# Patient Record
Sex: Male | Born: 1965 | Race: Black or African American | Hispanic: No | Marital: Married | Smoking: Never smoker
Health system: Southern US, Community
[De-identification: ages and names within clinical notes are randomized; demographics above are authoritative.]

## PROBLEM LIST (undated history)

## (undated) DIAGNOSIS — N433 Hydrocele, unspecified: Secondary | ICD-10-CM

## (undated) DIAGNOSIS — I10 Essential (primary) hypertension: Secondary | ICD-10-CM

## (undated) DIAGNOSIS — R109 Unspecified abdominal pain: Secondary | ICD-10-CM

## (undated) DIAGNOSIS — E7800 Pure hypercholesterolemia, unspecified: Secondary | ICD-10-CM

## (undated) DIAGNOSIS — E785 Hyperlipidemia, unspecified: Secondary | ICD-10-CM

## (undated) DIAGNOSIS — K625 Hemorrhage of anus and rectum: Secondary | ICD-10-CM

## (undated) DIAGNOSIS — M2550 Pain in unspecified joint: Secondary | ICD-10-CM

## (undated) DIAGNOSIS — I861 Scrotal varices: Secondary | ICD-10-CM

## (undated) DIAGNOSIS — J309 Allergic rhinitis, unspecified: Secondary | ICD-10-CM

## (undated) DIAGNOSIS — M109 Gout, unspecified: Secondary | ICD-10-CM

## (undated) DIAGNOSIS — M1990 Unspecified osteoarthritis, unspecified site: Secondary | ICD-10-CM

## (undated) DIAGNOSIS — R079 Chest pain, unspecified: Secondary | ICD-10-CM

## (undated) HISTORY — DX: Pain in unspecified joint: M25.50

## (undated) HISTORY — DX: Chest pain, unspecified: R07.9

## (undated) HISTORY — DX: Unspecified abdominal pain: R10.9

## (undated) HISTORY — DX: Allergic rhinitis, unspecified: J30.9

## (undated) HISTORY — DX: Essential (primary) hypertension: I10

## (undated) HISTORY — DX: Hemorrhage of anus and rectum: K62.5

## (undated) HISTORY — DX: Hydrocele, unspecified: N43.3

## (undated) HISTORY — DX: Pure hypercholesterolemia, unspecified: E78.00

## (undated) HISTORY — DX: Scrotal varices: I86.1

## (undated) HISTORY — PX: NOSE SURGERY: SHX723

---

## 1998-03-28 ENCOUNTER — Emergency Department (HOSPITAL_COMMUNITY): Admission: EM | Admit: 1998-03-28 | Discharge: 1998-03-28 | Payer: Self-pay | Admitting: Emergency Medicine

## 2000-02-04 ENCOUNTER — Encounter: Payer: Self-pay | Admitting: Family Medicine

## 2000-02-04 ENCOUNTER — Ambulatory Visit (HOSPITAL_COMMUNITY): Admission: RE | Admit: 2000-02-04 | Discharge: 2000-02-04 | Payer: Self-pay | Admitting: Family Medicine

## 2003-09-30 ENCOUNTER — Ambulatory Visit (HOSPITAL_COMMUNITY): Admission: RE | Admit: 2003-09-30 | Discharge: 2003-09-30 | Payer: Self-pay | Admitting: Family Medicine

## 2003-10-17 ENCOUNTER — Ambulatory Visit (HOSPITAL_COMMUNITY): Admission: RE | Admit: 2003-10-17 | Discharge: 2003-10-17 | Payer: Self-pay | Admitting: Family Medicine

## 2005-06-11 ENCOUNTER — Emergency Department (HOSPITAL_COMMUNITY): Admission: EM | Admit: 2005-06-11 | Discharge: 2005-06-11 | Payer: Self-pay | Admitting: Emergency Medicine

## 2008-01-13 ENCOUNTER — Ambulatory Visit (HOSPITAL_COMMUNITY): Admission: RE | Admit: 2008-01-13 | Discharge: 2008-01-13 | Payer: Self-pay | Admitting: Family Medicine

## 2009-02-21 ENCOUNTER — Emergency Department (HOSPITAL_COMMUNITY): Admission: EM | Admit: 2009-02-21 | Discharge: 2009-02-21 | Payer: Self-pay | Admitting: Emergency Medicine

## 2010-10-05 ENCOUNTER — Encounter: Payer: Self-pay | Admitting: Emergency Medicine

## 2010-12-22 LAB — DIFFERENTIAL
Basophils Absolute: 0 10*3/uL (ref 0.0–0.1)
Basophils Relative: 0 % (ref 0–1)
Eosinophils Absolute: 0.8 10*3/uL — ABNORMAL HIGH (ref 0.0–0.7)
Eosinophils Relative: 5 % (ref 0–5)
Lymphocytes Relative: 6 % — ABNORMAL LOW (ref 12–46)
Lymphs Abs: 0.9 10*3/uL (ref 0.7–4.0)
Monocytes Absolute: 1.6 10*3/uL — ABNORMAL HIGH (ref 0.1–1.0)
Monocytes Relative: 11 % (ref 3–12)
Neutro Abs: 11.6 10*3/uL — ABNORMAL HIGH (ref 1.7–7.7)
Neutrophils Relative %: 78 % — ABNORMAL HIGH (ref 43–77)

## 2010-12-22 LAB — URINALYSIS, ROUTINE W REFLEX MICROSCOPIC
Glucose, UA: NEGATIVE mg/dL
Hgb urine dipstick: NEGATIVE
Specific Gravity, Urine: 1.022 (ref 1.005–1.030)

## 2010-12-22 LAB — CBC
HCT: 45.7 % (ref 39.0–52.0)
Platelets: 317 10*3/uL (ref 150–400)
RDW: 13.2 % (ref 11.5–15.5)

## 2010-12-22 LAB — COMPREHENSIVE METABOLIC PANEL
AST: 27 U/L (ref 0–37)
Albumin: 4.1 g/dL (ref 3.5–5.2)
Alkaline Phosphatase: 110 U/L (ref 39–117)
BUN: 8 mg/dL (ref 6–23)
GFR calc Af Amer: >60 mL/min (ref >60)
Potassium: 3.3 mEq/L — ABNORMAL LOW (ref 3.5–5.1)
Total Protein: 7.1 g/dL (ref 6.0–8.3)

## 2010-12-22 LAB — LIPASE, BLOOD: Lipase: 79 U/L — ABNORMAL HIGH (ref 11–59)

## 2013-05-03 ENCOUNTER — Emergency Department (HOSPITAL_BASED_OUTPATIENT_CLINIC_OR_DEPARTMENT_OTHER): Payer: 59

## 2013-05-03 ENCOUNTER — Encounter (HOSPITAL_BASED_OUTPATIENT_CLINIC_OR_DEPARTMENT_OTHER): Payer: Self-pay | Admitting: *Deleted

## 2013-05-03 ENCOUNTER — Emergency Department (HOSPITAL_BASED_OUTPATIENT_CLINIC_OR_DEPARTMENT_OTHER)
Admission: EM | Admit: 2013-05-03 | Discharge: 2013-05-03 | Disposition: A | Discharge status: Home / Self Care | Payer: 59 | Attending: Emergency Medicine | Admitting: Emergency Medicine

## 2013-05-03 DIAGNOSIS — Z8639 Personal history of other endocrine, nutritional and metabolic disease: Secondary | ICD-10-CM | POA: Insufficient documentation

## 2013-05-03 DIAGNOSIS — E785 Hyperlipidemia, unspecified: Secondary | ICD-10-CM | POA: Insufficient documentation

## 2013-05-03 DIAGNOSIS — Z862 Personal history of diseases of the blood and blood-forming organs and certain disorders involving the immune mechanism: Secondary | ICD-10-CM | POA: Insufficient documentation

## 2013-05-03 DIAGNOSIS — Z79899 Other long term (current) drug therapy: Secondary | ICD-10-CM | POA: Insufficient documentation

## 2013-05-03 DIAGNOSIS — R079 Chest pain, unspecified: Secondary | ICD-10-CM

## 2013-05-03 DIAGNOSIS — R072 Precordial pain: Secondary | ICD-10-CM | POA: Insufficient documentation

## 2013-05-03 HISTORY — DX: Gout, unspecified: M10.9

## 2013-05-03 HISTORY — DX: Hyperlipidemia, unspecified: E78.5

## 2013-05-03 LAB — CBC
HCT: 42.2 % (ref 39.0–52.0)
Hemoglobin: 15 g/dL (ref 13.0–17.0)
MCV: 80.8 fL (ref 78.0–100.0)
RBC: 5.22 MIL/uL (ref 4.22–5.81)
WBC: 11.3 10*3/uL — ABNORMAL HIGH (ref 4.0–10.5)

## 2013-05-03 LAB — TROPONIN I: Troponin I: <0.3 ng/mL (ref <0.30)

## 2013-05-03 LAB — BASIC METABOLIC PANEL
BUN: 11 mg/dL (ref 6–23)
CO2: 27 mEq/L (ref 19–32)
Chloride: 100 mEq/L (ref 96–112)
Creatinine, Ser: 1 mg/dL (ref 0.50–1.35)

## 2013-05-03 MED ORDER — ASPIRIN 81 MG PO CHEW
324.0000 mg | CHEWABLE_TABLET | Freq: Once | ORAL | Status: AC
  Administered 2013-05-03: 324 mg via ORAL
  Filled 2013-05-03: qty 4

## 2013-05-03 NOTE — ED Provider Notes (Addendum)
CSN: [id number]     Arrival date & time 05/03/13  1057 History     First MD Initiated Contact with Patient 05/03/13 1115     Chief Complaint  Patient presents with  . Chest Pain   (Consider location/radiation/quality/duration/timing/severity/associated sxs/prior Treatment) Patient is a 47 y.o. male presenting with chest pain. The history is provided by the patient.  Chest Pain Pain location:  Substernal area and L chest Pain quality comment:  Heaviness Pain radiates to:  Does not radiate Pain radiates to the back: no   Pain severity:  No pain Onset quality:  Sudden Duration:  24 hours Timing:  Intermittent Progression:  Waxing and waning Chronicity:  New Relieved by:  Nothing Associated symptoms: no abdominal pain, no back pain, no cough, no fever, no nausea, no shortness of breath and not vomiting     Past Medical History  Diagnosis Date  . Gout   . Hyperlipidemia    History reviewed. No pertinent past surgical history. History reviewed. No pertinent family history. History  Substance Use Topics  . Smoking status: Never Smoker   . Smokeless tobacco: Not on file  . Alcohol Use: No    Review of Systems  Constitutional: Negative for fever and chills.  Respiratory: Negative for cough and shortness of breath.   Cardiovascular: Positive for chest pain. Negative for leg swelling.  Gastrointestinal: Negative for nausea, vomiting and abdominal pain.  Musculoskeletal: Negative for back pain.  All other systems reviewed and are negative.    Allergies  Penicillins  Home Medications   Current Outpatient Rx  Name  Route  Sig  Dispense  Refill  . rosuvastatin (CRESTOR) 40 MG tablet   Oral   Take 40 mg by mouth daily.          BP 142/79  Pulse 83  Temp(Src) 98.6 F (37 C) (Oral)  Resp 22  Ht 5\' 5"  (1.651 m)  Wt 183 lb (83.008 kg)  BMI 30.45 kg/m2  SpO2 99% Physical Exam  Nursing note and vitals reviewed. Constitutional: He is oriented to person, place, and  time. He appears well-developed and well-nourished.  HENT:  Head: Normocephalic and atraumatic.  Right Ear: External ear normal.  Left Ear: External ear normal.  Nose: Nose normal.  Eyes: Right eye exhibits no discharge. Left eye exhibits no discharge.  Neck: Neck supple.  Cardiovascular: Normal rate, regular rhythm, normal heart sounds and intact distal pulses.   Pulmonary/Chest: Effort normal and breath sounds normal. He exhibits no tenderness.  Abdominal: Soft. There is no tenderness.  Musculoskeletal: He exhibits no edema and no tenderness.  Neurological: He is alert and oriented to person, place, and time.  Skin: Skin is warm and dry.    ED Course   Procedures (including critical care time)  Labs Reviewed  CBC - Abnormal; Notable for the following:    WBC 11.3 (*)    All other components within normal limits  BASIC METABOLIC PANEL - Abnormal; Notable for the following:    Glucose, Bld 104 (*)    GFR calc non Af Amer 88 (*)    All other components within normal limits  TROPONIN I  TROPONIN I    Date: 05/03/2013  Rate: 79  Rhythm: normal sinus rhythm  QRS Axis: normal  Intervals: normal  ST/T Wave abnormalities: normal  Conduction Disutrbances:none  Narrative Interpretation: NSR w/o ischemia  Old EKG Reviewed: none available   Dg Chest 2 View  05/03/2013   *RADIOLOGY REPORT*  Clinical Data: Chest  pain  CHEST - 2 VIEW  Comparison: None.  Findings:  Lungs clear.  Heart size and pulmonary vascularity are normal.  No adenopathy.  No pneumothorax.  No bone lesions.  IMPRESSION: No abnormality noted.   Original Report Authenticated By: Bretta Bang, M.D.   1. Chest pain     MDM  47 year old male with a history of hyperlipidemia presents with chest pressure that started at work yesterday. The pain is intermittent and at the time of presentation is gone. EKG is benign. Remained pain-free in the ED. Unlikely to be aortic dissection or PE. Patient has no risk factors for  a PE and is PERC negative. Discussed with Dr. Nicholos Johns of [facility], he will help set up a stress test as an outpatient (tomorrow). With 2 negative troponins and patient being pain free and low risk I feel he is stable for outpatient workup.   Audree Camel, MD 05/03/13 1447

## 2013-05-03 NOTE — ED Notes (Signed)
Chest pressure onset 24 hours ago in mid chest then this am more to left side of chest  Denies any radiation of the pain denies increase of pain with deep breath states is somewhat tender when chest wall is palpated. Denies any nausea vomiting diarrhea or diaphoresis

## 2015-01-23 ENCOUNTER — Encounter: Payer: Self-pay | Admitting: *Deleted

## 2015-07-01 ENCOUNTER — Encounter (HOSPITAL_COMMUNITY): Payer: Self-pay | Admitting: Family Medicine

## 2015-07-01 ENCOUNTER — Emergency Department (HOSPITAL_COMMUNITY)
Admission: EM | Admit: 2015-07-01 | Discharge: 2015-07-01 | Disposition: A | Discharge status: Home / Self Care | Payer: Self-pay | Attending: Emergency Medicine | Admitting: Emergency Medicine

## 2015-07-01 ENCOUNTER — Emergency Department (HOSPITAL_COMMUNITY): Payer: Self-pay

## 2015-07-01 DIAGNOSIS — M109 Gout, unspecified: Secondary | ICD-10-CM | POA: Insufficient documentation

## 2015-07-01 DIAGNOSIS — R079 Chest pain, unspecified: Secondary | ICD-10-CM | POA: Insufficient documentation

## 2015-07-01 DIAGNOSIS — Z8639 Personal history of other endocrine, nutritional and metabolic disease: Secondary | ICD-10-CM | POA: Insufficient documentation

## 2015-07-01 DIAGNOSIS — Z880 Allergy status to penicillin: Secondary | ICD-10-CM | POA: Insufficient documentation

## 2015-07-01 DIAGNOSIS — Z7982 Long term (current) use of aspirin: Secondary | ICD-10-CM | POA: Insufficient documentation

## 2015-07-01 LAB — I-STAT TROPONIN, ED
Troponin i, poc: 0 ng/mL (ref 0.00–0.08)
Troponin i, poc: 0.01 ng/mL (ref 0.00–0.08)

## 2015-07-01 LAB — BASIC METABOLIC PANEL
ANION GAP: 8 (ref 5–15)
BUN: 6 mg/dL (ref 6–20)
CHLORIDE: 104 mmol/L (ref 101–111)
CO2: 26 mmol/L (ref 22–32)
Calcium: 9.2 mg/dL (ref 8.9–10.3)
Creatinine, Ser: 1.06 mg/dL (ref 0.61–1.24)
GFR calc non Af Amer: >60 mL/min (ref >60)
GLUCOSE: 147 mg/dL — AB (ref 65–99)
POTASSIUM: 3.5 mmol/L (ref 3.5–5.1)
Sodium: 138 mmol/L (ref 135–145)

## 2015-07-01 LAB — CBC
HEMATOCRIT: 43.1 % (ref 39.0–52.0)
HEMOGLOBIN: 15.2 g/dL (ref 13.0–17.0)
MCH: 28.7 pg (ref 26.0–34.0)
MCHC: 35.3 g/dL (ref 30.0–36.0)
MCV: 81.5 fL (ref 78.0–100.0)
Platelets: 352 10*3/uL (ref 150–400)
RBC: 5.29 MIL/uL (ref 4.22–5.81)
RDW: 12.5 % (ref 11.5–15.5)
WBC: 11.2 10*3/uL — ABNORMAL HIGH (ref 4.0–10.5)

## 2015-07-01 NOTE — ED Provider Notes (Signed)
CSN: [id number]     Arrival date & time 07/01/15  1535 History   First MD Initiated Contact with Patient 07/01/15 1826     Chief Complaint  Patient presents with  . Chest Pain     (Consider location/radiation/quality/duration/timing/severity/associated sxs/prior Treatment) Patient is a 49 y.o. male presenting with chest pain.  Chest Pain Pain location:  Substernal area Pain quality: pressure   Pain radiates to:  Does not radiate Pain radiates to the back: no   Pain severity:  Moderate Onset quality:  Gradual Duration:  2 days Timing:  Constant Progression:  Unchanged Chronicity:  New Context: at rest   Relieved by:  Nothing Worsened by:  Nothing tried Associated symptoms: no abdominal pain, no dizziness, no fever, no nausea, no shortness of breath and not vomiting     Past Medical History  Diagnosis Date  . Gout   . Hyperlipidemia    History reviewed. No pertinent past surgical history. Family History  Problem Relation Age of Onset  . Diabetes Neg Hx   . Hypertension Neg Hx   . Kidney disease Neg Hx   . Heart attack Brother 7753   Social History  Substance Use Topics  . Smoking status: Never Smoker   . Smokeless tobacco: None  . Alcohol Use: No    Review of Systems  Constitutional: Negative for fever.  Respiratory: Negative for shortness of breath.   Cardiovascular: Positive for chest pain.  Gastrointestinal: Negative for nausea, vomiting and abdominal pain.  Neurological: Negative for dizziness.  All other systems reviewed and are negative.     Allergies  Penicillins  Home Medications   Prior to Admission medications   Medication Sig Start Date End Date Taking? Authorizing Provider  aspirin EC 81 MG tablet Take 325 mg by mouth once.   Yes Historical Provider, MD  ibuprofen (ADVIL,MOTRIN) 200 MG tablet Take 400 mg by mouth every 6 (six) hours as needed for headache or mild pain.   Yes Historical Provider, MD   BP 134/79 mmHg  Pulse 67  Temp(Src)  98.4 F (36.9 C) (Oral)  Resp 20  Wt 179 lb 4 oz (81.307 kg)  SpO2 98% Physical Exam  Constitutional: He is oriented to person, place, and time. He appears well-developed and well-nourished.  HENT:  Head: Normocephalic and atraumatic.  Eyes: Conjunctivae and EOM are normal.  Neck: Normal range of motion. Neck supple.  Cardiovascular: Normal rate, regular rhythm and normal heart sounds.   Pulmonary/Chest: Effort normal and breath sounds normal. No respiratory distress.  Abdominal: He exhibits no distension. There is no tenderness. There is no rebound and no guarding.  Musculoskeletal: Normal range of motion.       Cervical back: He exhibits tenderness and bony tenderness.       Thoracic back: Normal.       Lumbar back: Normal.  Neurological: He is alert and oriented to person, place, and time.  No neuro deficits of upper extremities, lower extremities  Skin: Skin is warm and dry.  Vitals reviewed.   ED Course  Procedures (including critical care time) Labs Review Labs Reviewed  BASIC METABOLIC PANEL - Abnormal; Notable for the following:    Glucose, Bld 147 (*)    All other components within normal limits  CBC - Abnormal; Notable for the following:    WBC 11.2 (*)    All other components within normal limits  I-STAT TROPOININ, ED  Rosezena SensorI-STAT TROPOININ, ED    Imaging Review Dg Chest 2 View  07/01/2015  CLINICAL DATA:  Chest pain EXAM: CHEST  2 VIEW COMPARISON:  05/03/2013 chest rate FINDINGS: Stable cardiomediastinal silhouette with normal heart size. No pneumothorax. No pleural effusion. Clear lungs, with no focal lung consolidation and no pulmonary edema. IMPRESSION: No active cardiopulmonary disease. Electronically Signed   By: Delbert Phenix M.D.   On: 07/01/2015 16:16   I have personally reviewed and evaluated these images and lab results as part of my medical decision-making.   EKG Interpretation   Date/Time:  Monday July 01 2015 15:45:24 EDT Ventricular Rate:   65 PR Interval:  158 QRS Duration: 84 QT Interval:  398 QTC Calculation: 413 R Axis:   66 Text Interpretation:  Normal sinus rhythm Normal ECG No significant change  since last tracing Confirmed by Mirian Mo [phone]) on 07/01/2015  7:02:00 PM      MDM   Final diagnoses:  Chest pain, unspecified chest pain type    49 y.o. male with pertinent PMH of HTN, gout, HLD presents with chest pain as above, atypical for ACS.  Has radiation of tingling and pain to R arm from neck.  Physical exam as above with reproduction of symptoms on palpation of neck, no recent trauma.  Otherwise benign.  No pain in chest at time of exam.  Delta trop negative.  Will have pt fu with cardiology.  Standard return precautions given.    I have reviewed all laboratory and imaging studies if ordered as above  1. Chest pain, unspecified chest pain type         Mirian Mo, MD 07/02/15 [phone]

## 2015-07-01 NOTE — ED Notes (Signed)
[facility]. Pt has been having intermittent chest pain for a few days. sts pressure in central chest and some right arm numbness. Pt sts arm numbness has improved.

## 2015-07-01 NOTE — ED Notes (Signed)
MD at bedside. 

## 2015-07-01 NOTE — Discharge Instructions (Signed)
Nonspecific Chest Pain  °Chest pain can be caused by many different conditions. There is always a chance that your pain could be related to something serious, such as a heart attack or a blood clot in your lungs. Chest pain can also be caused by conditions that are not life-threatening. If you have chest pain, it is very important to follow up with your health care provider. °CAUSES  °Chest pain can be caused by: °· Heartburn. °· Pneumonia or bronchitis. °· Anxiety or stress. °· Inflammation around your heart (pericarditis) or lung (pleuritis or pleurisy). °· A blood clot in your lung. °· A collapsed lung (pneumothorax). It can develop suddenly on its own (spontaneous pneumothorax) or from trauma to the chest. °· Shingles infection (varicella-zoster virus). °· Heart attack. °· Damage to the bones, muscles, and cartilage that make up your chest wall. This can include: °¨ Bruised bones due to injury. °¨ Strained muscles or cartilage due to frequent or repeated coughing or overwork. °¨ Fracture to one or more ribs. °¨ Sore cartilage due to inflammation (costochondritis). °RISK FACTORS  °Risk factors for chest pain may include: °· Activities that increase your risk for trauma or injury to your chest. °· Respiratory infections or conditions that cause frequent coughing. °· Medical conditions or overeating that can cause heartburn. °· Heart disease or family history of heart disease. °· Conditions or health behaviors that increase your risk of developing a blood clot. °· Having had chicken pox (varicella zoster). °SIGNS AND SYMPTOMS °Chest pain can feel like: °· Burning or tingling on the surface of your chest or deep in your chest. °· Crushing, pressure, aching, or squeezing pain. °· Dull or sharp pain that is worse when you move, cough, or take a deep breath. °· Pain that is also felt in your back, neck, shoulder, or arm, or pain that spreads to any of these areas. °Your chest pain may come and go, or it may stay  constant. °DIAGNOSIS °Lab tests or other studies may be needed to find the cause of your pain. Your health care provider may have you take a test called an ambulatory ECG (electrocardiogram). An ECG records your heartbeat patterns at the time the test is performed. You may also have other tests, such as: °· Transthoracic echocardiogram (TTE). During echocardiography, sound waves are used to create a picture of all of the heart structures and to look at how blood flows through your heart. °· Transesophageal echocardiogram (TEE). This is a more advanced imaging test that obtains images from inside your body. It allows your health care provider to see your heart in finer detail. °· Cardiac monitoring. This allows your health care provider to monitor your heart rate and rhythm in real time. °· Holter monitor. This is a portable device that records your heartbeat and can help to diagnose abnormal heartbeats. It allows your health care provider to track your heart activity for several days, if needed. °· Stress tests. These can be done through exercise or by taking medicine that makes your heart beat more quickly. °· Blood tests. °· Imaging tests. °TREATMENT  °Your treatment depends on what is causing your chest pain. Treatment may include: °· Medicines. These may include: °¨ Acid blockers for heartburn. °¨ Anti-inflammatory medicine. °¨ Pain medicine for inflammatory conditions. °¨ Antibiotic medicine, if an infection is present. °¨ Medicines to dissolve blood clots. °¨ Medicines to treat coronary artery disease. °· Supportive care for conditions that do not require medicines. This may include: °¨ Resting. °¨ Applying heat   or cold packs to injured areas. °¨ Limiting activities until pain decreases. °HOME CARE INSTRUCTIONS °· If you were prescribed an antibiotic medicine, finish it all even if you start to feel better. °· Avoid any activities that bring on chest pain. °· Do not use any tobacco products, including  cigarettes, chewing tobacco, or electronic cigarettes. If you need help quitting, ask your health care provider. °· Do not drink alcohol. °· Take medicines only as directed by your health care provider. °· Keep all follow-up visits as directed by your health care provider. This is important. This includes any further testing if your chest pain does not go away. °· If heartburn is the cause for your chest pain, you may be told to keep your head raised (elevated) while sleeping. This reduces the chance that acid will go from your stomach into your esophagus. °· Make lifestyle changes as directed by your health care provider. These may include: °¨ Getting regular exercise. Ask your health care provider to suggest some activities that are safe for you. °¨ Eating a heart-healthy diet. A registered dietitian can help you to learn healthy eating options. °¨ Maintaining a healthy weight. °¨ Managing diabetes, if necessary. °¨ Reducing stress. °SEEK MEDICAL CARE IF: °· Your chest pain does not go away after treatment. °· You have a rash with blisters on your chest. °· You have a fever. °SEEK IMMEDIATE MEDICAL CARE IF:  °· Your chest pain is worse. °· You have an increasing cough, or you cough up blood. °· You have severe abdominal pain. °· You have severe weakness. °· You faint. °· You have chills. °· You have sudden, unexplained chest discomfort. °· You have sudden, unexplained discomfort in your arms, back, neck, or jaw. °· You have shortness of breath at any time. °· You suddenly start to sweat, or your skin gets clammy. °· You feel nauseous or you vomit. °· You suddenly feel light-headed or dizzy. °· Your heart begins to beat quickly, or it feels like it is skipping beats. °These symptoms may represent a serious problem that is an emergency. Do not wait to see if the symptoms will go away. Get medical help right away. Call your local emergency services (911 in the [place] Do not drive yourself to the hospital. °  °This  information is not intended to replace advice given to you by your health care provider. Make sure you discuss any questions you have with your health care provider. °  °Document Released: 06/10/2005 Document Revised: 09/21/2014 Document Reviewed: 04/06/2014 °Elsevier Interactive Patient Education ©2016 [employer] ° °

## 2015-07-25 ENCOUNTER — Ambulatory Visit: Payer: Self-pay | Admitting: Cardiovascular Disease

## 2019-07-18 NOTE — Progress Notes (Deleted)
Cardiology Office Note:    Date:  07/18/2019   ID:  DENALI BECVAR, DOB 1966/02/12, MRN [id number]  PCP:  Antony Contras, MD  Cardiologist:  No primary care provider on file.   Referring MD: Scifres, Dorothy, PA-C   No chief complaint on file.   History of Present Illness:    Carl Davis is a 53 y.o. male with a hx of ***  Past Medical History:  Diagnosis Date  . Abdominal pain   . Allergic rhinitis   . Chest pain   . Elevated LDL cholesterol level   . Gout   . HTN (hypertension)   . Hydrocele, bilateral   . Hyperlipidemia   . Joint pain   . Left varicocele   . Rectal bleeding     No past surgical history on file.  Current Medications: No outpatient medications have been marked as taking for the 07/19/19 encounter (Appointment) with Belva Crome, MD.     Allergies:   Penicillins   Social History   Socioeconomic History  . Marital status: Married    Spouse name: Not on file  . Number of children: Not on file  . Years of education: Not on file  . Highest education level: Not on file  Occupational History  . Not on file  Social Needs  . Financial resource strain: Not on file  . Food insecurity    Worry: Not on file    Inability: Not on file  . Transportation needs    Medical: Not on file    Non-medical: Not on file  Tobacco Use  . Smoking status: Never Smoker  . Smokeless tobacco: Never Used  Substance and Sexual Activity  . Alcohol use: No  . Drug use: No  . Sexual activity: Not on file  Lifestyle  . Physical activity    Days per week: Not on file    Minutes per session: Not on file  . Stress: Not on file  Relationships  . Social Herbalist on phone: Not on file    Gets together: Not on file    Attends religious service: Not on file    Active member of club or organization: Not on file    Attends meetings of clubs or organizations: Not on file    Relationship status: Not on file  Other Topics Concern  . Not on file  Social History  Narrative  . Not on file     Family History: The patient's family history includes Heart attack (age of onset: 64) in his brother. There is no history of Diabetes, Hypertension, or Kidney disease.  ROS:   Please see the history of present illness.    *** All other systems reviewed and are negative.  EKGs/Labs/Other Studies Reviewed:    The following studies were reviewed today: ***  EKG:  EKG ***  Recent Labs: No results found for requested labs within last 8760 hours.  Recent Lipid Panel No results found for: CHOL, TRIG, HDL, CHOLHDL, VLDL, LDLCALC, LDLDIRECT  Physical Exam:    VS:  There were no vitals taken for this visit.    Wt Readings from Last 3 Encounters:  07/01/15 179 lb 4 oz (81.3 kg)  05/03/13 183 lb (83 kg)     GEN: ***. No acute distress HEENT: Normal NECK: No JVD. LYMPHATICS: No lymphadenopathy CARDIAC: *** RRR without murmur, gallop, or edema. VASCULAR: *** Normal Pulses. No bruits. RESPIRATORY:  Clear to auscultation without rales, wheezing or rhonchi  ABDOMEN: Soft, non-tender, non-distended, No pulsatile mass, MUSCULOSKELETAL: No deformity  SKIN: Warm and dry NEUROLOGIC:  Alert and oriented x 3 PSYCHIATRIC:  Normal affect   ASSESSMENT:    No diagnosis found. PLAN:    In order of problems listed above:  1. ***   Medication Adjustments/Labs and Tests Ordered: Current medicines are reviewed at length with the patient today.  Concerns regarding medicines are outlined above.  No orders of the defined types were placed in this encounter.  No orders of the defined types were placed in this encounter.   There are no Patient Instructions on file for this visit.   Signed, Lesleigh Noe, MD  07/18/2019 5:37 PM    [facility]

## 2019-07-19 ENCOUNTER — Ambulatory Visit: Payer: Self-pay | Admitting: Interventional Cardiology

## 2019-07-26 ENCOUNTER — Encounter: Payer: Self-pay | Admitting: Family Medicine

## 2019-10-09 ENCOUNTER — Ambulatory Visit
Admission: RE | Admit: 2019-10-09 | Discharge: 2019-10-09 | Disposition: A | Discharge status: Home / Self Care | Payer: Managed Care, Other (non HMO) | Source: Ambulatory Visit | Attending: Family Medicine | Admitting: Family Medicine

## 2019-10-09 ENCOUNTER — Other Ambulatory Visit: Payer: Self-pay | Admitting: Family Medicine

## 2019-10-09 DIAGNOSIS — R1031 Right lower quadrant pain: Secondary | ICD-10-CM

## 2019-12-01 ENCOUNTER — Other Ambulatory Visit: Payer: Self-pay

## 2019-12-01 ENCOUNTER — Encounter: Payer: Self-pay | Admitting: Orthopaedic Surgery

## 2019-12-01 ENCOUNTER — Ambulatory Visit (INDEPENDENT_AMBULATORY_CARE_PROVIDER_SITE_OTHER): Payer: Managed Care, Other (non HMO) | Admitting: Orthopaedic Surgery

## 2019-12-01 ENCOUNTER — Ambulatory Visit: Payer: Self-pay

## 2019-12-01 DIAGNOSIS — M1611 Unilateral primary osteoarthritis, right hip: Secondary | ICD-10-CM

## 2019-12-01 MED ORDER — MELOXICAM 7.5 MG PO TABS: 7.5000 mg | ORAL_TABLET | Freq: Two times a day (BID) | ORAL | 2 refills | Status: DC | PRN

## 2019-12-01 NOTE — Progress Notes (Signed)
Office Visit Note   Patient: Carl Davis           Date of Birth: 1965/12/13           MRN: [id number] Visit Date: 12/01/2019              Requested by: Antony Contras, MD [address] [place],  [place] [address] PCP: Antony Contras, MD   Assessment & Plan: Visit Diagnoses:  1. Primary osteoarthritis of right hip     Plan: Impression is right hip DJD.  We reviewed the x-rays on file which shows moderate hip DJD.  We had a thorough discussion on treatment options and he has elected to start meloxicam as well as a hip injection today.  Patient will follow up as needed.  Questions encouraged and answered.  Follow-Up Instructions: Return if symptoms worsen or fail to improve.   Orders:  Orders Placed This Encounter  Procedures  . US Guided Needle Placement   Meds ordered this encounter  Medications  . meloxicam (MOBIC) 7.5 MG tablet    Sig: Take 1 tablet (7.5 mg total) by mouth 2 (two) times daily as needed for pain.    Dispense:  30 tablet    Refill:  2      Procedures: No procedures performed   Clinical Data: No additional findings.   Subjective: Chief Complaint  Patient presents with  . Right Hip - Pain  . Right Knee - Pain    Carl Davis is a very pleasant 54 year old gentleman comes in for evaluation of right hip and knee pain without known injuries.  He has had this pain for about a year and recently saw his PCP.  They first worked up his back but this turned out to be negative.  He has occasional back pain but no numbness and tingling or radicular symptoms.  He has hip pain that radiates through the thigh down into the knee.  He has trouble putting on his socks he feels stiffness in his hip.  He feels like the leg wants to give out.   Review of Systems  Constitutional: Negative.   All other systems reviewed and are negative.    Objective: Vital Signs: There were no vitals taken for this visit.  Physical Exam Vitals and nursing note  reviewed.  Constitutional:      Appearance: He is well-developed.  HENT:     Head: Normocephalic and atraumatic.  Eyes:     Pupils: Pupils are equal, round, and reactive to light.  Pulmonary:     Effort: Pulmonary effort is normal.  Abdominal:     Palpations: Abdomen is soft.  Musculoskeletal:        General: Normal range of motion.     Cervical back: Neck supple.  Skin:    General: Skin is warm.  Neurological:     Mental Status: He is alert and oriented to person, place, and time.  Psychiatric:        Behavior: Behavior normal.        Thought Content: Thought content normal.        Judgment: Judgment normal.     Ortho Exam Right hip shows moderate stiffness with pain.  Positive Stinchfield sign.  Hip is nontender.  No sciatic tension signs.  Pain with internal and external rotation.  Mildly positive logroll. Knee exam is unremarkable. Specialty Comments:  No specialty comments available.  Imaging: US Guided Needle Placement  Result Date: 12/01/2019 Please see Notes  tab for imaging impression.    PMFS History: Patient Active Problem List   Diagnosis Date Noted  . Primary osteoarthritis of right hip 12/01/2019   Past Medical History:  Diagnosis Date  . Abdominal pain   . Allergic rhinitis   . Chest pain   . Elevated LDL cholesterol level   . Gout   . HTN (hypertension)   . Hydrocele, bilateral   . Hyperlipidemia   . Joint pain   . Left varicocele   . Rectal bleeding     Family History  Problem Relation Age of Onset  . Heart attack Brother 63  . Diabetes Neg Hx   . Hypertension Neg Hx   . Kidney disease Neg Hx     History reviewed. No pertinent surgical history. Social History   Occupational History  . Not on file  Tobacco Use  . Smoking status: Never Smoker  . Smokeless tobacco: Never Used  Substance and Sexual Activity  . Alcohol use: No  . Drug use: No  . Sexual activity: Not on file

## 2019-12-01 NOTE — Progress Notes (Signed)
Subjective: Patient is here for ultrasound-guided intra-articular right hip injection.   Posterior and anterior pain, unable to put on sock.  Objective:  Stiffness and some pain with IR.  Procedure: Ultrasound-guided right hip injection: After sterile prep with Betadine, injected 8 cc 1% lidocaine without epinephrine and 40 mg methylprednisolone using a 22-gauge spinal needle, passing the needle through the iliofemoral ligament into the femoral head/neck junction.  Injectate seen filling joint capsule.  Able to touch right foot afterward.

## 2020-02-02 ENCOUNTER — Telehealth: Payer: Self-pay | Admitting: Orthopaedic Surgery

## 2020-02-02 NOTE — Telephone Encounter (Signed)
Pt called in stating the pain in his hip has returned and he would like to know what are his options in dealing with it?  [phone]

## 2020-02-02 NOTE — Telephone Encounter (Signed)
Appt scheduled with xu for Tuesday.

## 2020-02-02 NOTE — Telephone Encounter (Signed)
It is too soon for another cortisone injection.  We can try a prescription anti-inflammatory.  Would he like to come in for another evaluation.

## 2020-02-02 NOTE — Telephone Encounter (Signed)
Pls advise. Thanks.  

## 2020-02-06 ENCOUNTER — Encounter: Payer: Self-pay | Admitting: Orthopaedic Surgery

## 2020-02-06 ENCOUNTER — Other Ambulatory Visit: Payer: Self-pay

## 2020-02-06 ENCOUNTER — Ambulatory Visit (INDEPENDENT_AMBULATORY_CARE_PROVIDER_SITE_OTHER): Payer: Managed Care, Other (non HMO) | Admitting: Orthopaedic Surgery

## 2020-02-06 DIAGNOSIS — M1611 Unilateral primary osteoarthritis, right hip: Secondary | ICD-10-CM

## 2020-02-06 NOTE — Progress Notes (Signed)
Office Visit Note   Patient: Carl Davis           Date of Birth: 15-Sep-1965           MRN: [id number] Visit Date: 02/06/2020              Requested by: Tally Joe, MD [phone] [address] [place],  [place] [address] PCP: Tally Joe, MD   Assessment & Plan: Visit Diagnoses:  1. Primary osteoarthritis of right hip     Plan: Impression is symptomatic right hip DJD.  He received 1 month of relief from the cortisone injection.  I feel that his DJD is probably more advanced than what the x-rays are demonstrating therefore I would like to get an MRI to fully evaluate the severity of his DJD as well as for a labral tear.  We will see him back after the MRI.  Follow-Up Instructions: Return if symptoms worsen or fail to improve.   Orders:  No orders of the defined types were placed in this encounter.  No orders of the defined types were placed in this encounter.     Procedures: No procedures performed   Clinical Data: No additional findings.   Subjective: Chief Complaint  Patient presents with  . Right Hip - Pain    [name] returns today for follow-up of chronic right hip and groin pain.  Previous cortisone injection gave him 1 month of relief.  During that time he was able to put on socks without pain.  He is now back to the same hip and groin pain.   Review of Systems  Constitutional: Negative.   All other systems reviewed and are negative.    Objective: Vital Signs: There were no vitals taken for this visit.  Physical Exam Vitals and nursing note reviewed.  Constitutional:      Appearance: He is well-developed.  Pulmonary:     Effort: Pulmonary effort is normal.  Abdominal:     Palpations: Abdomen is soft.  Skin:    General: Skin is warm.  Neurological:     Mental Status: He is alert and oriented to person, place, and time.  Psychiatric:        Behavior: Behavior normal.        Thought Content: Thought content normal.        Judgment: Judgment  normal.     Ortho Exam Right hip exam is unchanged. Specialty Comments:  No specialty comments available.  Imaging: No results found.   PMFS History: Patient Active Problem List   Diagnosis Date Noted  . Primary osteoarthritis of right hip 12/01/2019   Past Medical History:  Diagnosis Date  . Abdominal pain   . Allergic rhinitis   . Chest pain   . Elevated LDL cholesterol level   . Gout   . HTN (hypertension)   . Hydrocele, bilateral   . Hyperlipidemia   . Joint pain   . Left varicocele   . Rectal bleeding     Family History  Problem Relation Age of Onset  . Heart attack Brother 60  . Diabetes Neg Hx   . Hypertension Neg Hx   . Kidney disease Neg Hx     History reviewed. No pertinent surgical history. Social History   Occupational History  . Not on file  Tobacco Use  . Smoking status: Never Smoker  . Smokeless tobacco: Never Used  Substance and Sexual Activity  . Alcohol use: No  . Drug use: No  .  Sexual activity: Not on file

## 2020-02-08 ENCOUNTER — Other Ambulatory Visit: Payer: Self-pay

## 2020-02-08 DIAGNOSIS — M1611 Unilateral primary osteoarthritis, right hip: Secondary | ICD-10-CM

## 2020-02-14 ENCOUNTER — Telehealth: Payer: Self-pay | Admitting: Orthopaedic Surgery

## 2020-02-14 NOTE — Telephone Encounter (Signed)
faxed

## 2020-02-14 NOTE — Telephone Encounter (Signed)
Jolie from [facility] called.   They need the patient's MRI orders faxed over   Fax number: [phone] Call back: [phone]

## 2020-03-01 ENCOUNTER — Ambulatory Visit: Payer: 59 | Admitting: Orthopaedic Surgery

## 2020-03-06 ENCOUNTER — Other Ambulatory Visit: Payer: 59

## 2020-03-08 ENCOUNTER — Ambulatory Visit: Payer: 59 | Admitting: Orthopaedic Surgery

## 2020-03-08 ENCOUNTER — Other Ambulatory Visit: Payer: Self-pay | Admitting: Orthopaedic Surgery

## 2020-03-08 DIAGNOSIS — M1611 Unilateral primary osteoarthritis, right hip: Secondary | ICD-10-CM

## 2020-03-26 ENCOUNTER — Ambulatory Visit
Admission: RE | Admit: 2020-03-26 | Discharge: 2020-03-26 | Disposition: A | Discharge status: Home / Self Care | Payer: 59 | Source: Ambulatory Visit | Attending: Orthopaedic Surgery | Admitting: Orthopaedic Surgery

## 2020-03-26 ENCOUNTER — Other Ambulatory Visit: Payer: Self-pay

## 2020-03-26 DIAGNOSIS — M1611 Unilateral primary osteoarthritis, right hip: Secondary | ICD-10-CM

## 2020-03-26 MED ORDER — IOPAMIDOL (ISOVUE-M 200) INJECTION 41%
15.0000 mL | Freq: Once | INTRAMUSCULAR | Status: AC
  Administered 2020-03-26: 15 mL via INTRA_ARTICULAR

## 2020-04-05 ENCOUNTER — Encounter: Payer: Self-pay | Admitting: Orthopaedic Surgery

## 2020-04-05 ENCOUNTER — Ambulatory Visit (INDEPENDENT_AMBULATORY_CARE_PROVIDER_SITE_OTHER): Payer: 59 | Admitting: Orthopaedic Surgery

## 2020-04-05 VITALS — Ht 65.0 in | Wt 182.0 lb

## 2020-04-05 DIAGNOSIS — M1611 Unilateral primary osteoarthritis, right hip: Secondary | ICD-10-CM | POA: Diagnosis not present

## 2020-04-05 MED ORDER — DICLOFENAC SODIUM 75 MG PO TBEC: 75.0000 mg | DELAYED_RELEASE_TABLET | Freq: Two times a day (BID) | ORAL | 2 refills | Status: DC

## 2020-04-06 NOTE — Progress Notes (Signed)
Office Visit Note   Patient: Carl Davis           Date of Birth: Jan 18, 1966           MRN: [id number] Visit Date: 04/05/2020              Requested by: Tally Joe, MD [phone] [address] [place],  [place] [address] PCP: Tally Joe, MD   Assessment & Plan: Visit Diagnoses:  1. Primary osteoarthritis of right hip     Plan: MRI is consistent with end-stage DJD.  These findings were reviewed with the patient in detail and based on our discussion of options he would like to move forward with a total hip replacement at this point.  Risk benefits rehab recovery reviewed with the patient in detail.  Questions encouraged and answered.  Prescription for diclofenac.  We look forward to treating him in the near future.  Follow-Up Instructions: Return if symptoms worsen or fail to improve.   Orders:  No orders of the defined types were placed in this encounter.  Meds ordered this encounter  Medications  . diclofenac (VOLTAREN) 75 MG EC tablet    Sig: Take 1 tablet (75 mg total) by mouth 2 (two) times daily.    Dispense:  30 tablet    Refill:  2      Procedures: No procedures performed   Clinical Data: No additional findings.   Subjective: Chief Complaint  Patient presents with  . Right Hip - Pain, Follow-up    MRI Review    Carl Davis returns today for MRI review of the right hip.  He continues to have severe right hip pain.  Denies any changes in medical history.  He has a difficulty sleeping at night due to the right hip pain.   Review of Systems  Constitutional: Negative.   All other systems reviewed and are negative.    Objective: Vital Signs: Ht 5\' 5"  (1.651 m)   Wt 182 lb (82.6 kg)   BMI 30.29 kg/m   Physical Exam Vitals and nursing note reviewed.  Constitutional:      Appearance: He is well-developed.  Pulmonary:     Effort: Pulmonary effort is normal.  Abdominal:     Palpations: Abdomen is soft.  Skin:    General: Skin is warm.    Neurological:     Mental Status: He is alert and oriented to person, place, and time.  Psychiatric:        Behavior: Behavior normal.        Thought Content: Thought content normal.        Judgment: Judgment normal.     Ortho Exam Right hip exam is unchanged. Specialty Comments:  No specialty comments available.  Imaging: No results found.   PMFS History: Patient Active Problem List   Diagnosis Date Noted  . Primary osteoarthritis of right hip 12/01/2019   Past Medical History:  Diagnosis Date  . Abdominal pain   . Allergic rhinitis   . Chest pain   . Elevated LDL cholesterol level   . Gout   . HTN (hypertension)   . Hydrocele, bilateral   . Hyperlipidemia   . Joint pain   . Left varicocele   . Rectal bleeding     Family History  Problem Relation Age of Onset  . Heart attack Brother 36  . Diabetes Neg Hx   . Hypertension Neg Hx   . Kidney disease Neg Hx     No  past surgical history on file. Social History   Occupational History  . Not on file  Tobacco Use  . Smoking status: Never Smoker  . Smokeless tobacco: Never Used  Substance and Sexual Activity  . Alcohol use: No  . Drug use: No  . Sexual activity: Not on file

## 2020-04-22 ENCOUNTER — Other Ambulatory Visit: Payer: Self-pay

## 2020-05-02 NOTE — Progress Notes (Signed)
DUE TO COVID-19 ONLY ONE VISITOR IS ALLOWED TO COME WITH YOU AND STAY IN THE WAITING ROOM ONLY DURING PRE OP AND PROCEDURE DAY OF SURGERY. THE 1 VISITOR  MAY VISIT WITH YOU AFTER SURGERY IN YOUR PRIVATE ROOM DURING VISITING HOURS ONLY!  YOU NEED TO HAVE A COVID 19 TEST ON_______ @_______ , THIS TEST MUST BE DONE BEFORE SURGERY,  COVID TESTING SITE [address] [place] , IT IS ON THE RIGHT GOING OUT [address] APPROXIMATELY  2 MINUTES PAST [employer] ON THE RIGHT. ONCE YOUR COVID TEST IS COMPLETED,  PLEASE BEGIN THE QUARANTINE INSTRUCTIONS AS OUTLINED IN YOUR HANDOUT.                Carl Davis  05/02/2020   Your procedure is scheduled on:  05/06/2020   Report to [facility] Main  Entrance   Report to admitting at    0730 AM     Call this number if you have problems the morning of surgery [phone]    Remember: Do not eat food   :After Midnight. BRUSH YOUR TEETH MORNING OF SURGERY AND RINSE YOUR MOUTH OUT, NO CHEWING GUM CANDY OR MINTS.  NO SOLID FOOD AFTER MIDNIGHT THE NIGHT PRIOR TO SURGERY. NOTHING BY MOUTH EXCEPT CLEAR LIQUIDS UNTIL .0700am PLEASE FINISH ENSURE DRINK PER SURGEON ORDER  WHICH NEEDS TO BE COMPLETED AT .0700am    CLEAR LIQUID DIET   Foods Allowed                                                                    Coffee and tea, regular and decaf                           Plain Jell-O any favor                                      Fruit ices (not with fruit pulp)                                      Iced Popsicles                                     Carbonated beverages, regular and diet                                    Cranberry, grape and apple juices Sports drinks like Gatorade Lightly seasoned clear broth or consume(fat free) Sugar, honey syrup  _____________________________________________________________________   Take these medicines the morning of surgery with A SIP OF WATER:  Allopurinol, Amlodipine, zyrtec                                 You may not have any metal on your body including hair pins and              piercings  Do not wear jewelry, make-up, lotions, powders or perfumes, deodorant             Do not wear nail polish on your fingernails.  Do not shave  48 hours prior to surgery.              Men may shave face and neck.   Do not bring valuables to the hospital. [facility] IS NOT             RESPONSIBLE   FOR VALUABLES.  Contacts, dentures or bridgework may not be worn into surgery.  Leave suitcase in the car. After surgery it may be brought to your room.     Patients discharged the day of surgery will not be allowed to drive home. IF YOU ARE HAVING SURGERY AND GOING HOME THE SAME DAY, YOU MUST HAVE AN ADULT TO DRIVE YOU HOME AND BE WITH YOU FOR 24 HOURS. YOU MAY GO HOME BY TAXI OR UBER OR ORTHERWISE, BUT AN ADULT MUST ACCOMPANY YOU HOME AND STAY WITH YOU FOR 24 HOURS.  Name and phone number of your driver:  Special Instructions: N/A              Please read over the following fact sheets you were given: _____________________________________________________________________   [facility] - Preparing for Surgery Before surgery, you can play an important role.  Because skin is not sterile, your skin needs to be as free of germs as possible.  You can reduce the number of germs on your skin by washing with CHG (chlorahexidine gluconate) soap before surgery.  CHG is an antiseptic cleaner which kills germs and bonds with the skin to continue killing germs even after washing. Please DO NOT use if you have an allergy to CHG or antibacterial soaps.  If your skin becomes reddened/irritated stop using the CHG and inform your nurse when you arrive at Short Stay. Do not shave (including legs and underarms) for at least 48  hours prior to the first CHG shower.  You may shave your face/neck. Please follow these instructions carefully:  1.  Shower with CHG Soap the night before surgery and the  morning of Surgery.  2.  If you choose to wash your hair, wash your hair first as usual with your  normal  shampoo.  3.  After you shampoo, rinse your hair and body thoroughly to remove the  shampoo.                           4.  Use CHG as you would any other liquid soap.  You can apply chg directly  to the skin and wash                       Gently with a scrungie or clean washcloth.  5.  Apply the CHG Soap to your body ONLY FROM THE NECK DOWN.   Do not use on face/ open  Wound or open sores. Avoid contact with eyes, ears mouth and genitals (private parts).                       Wash face,  Genitals (private parts) with your normal soap.             6.  Wash thoroughly, paying special attention to the area where your surgery  will be performed.  7.  Thoroughly rinse your body with warm water from the neck down.  8.  DO NOT shower/wash with your normal soap after using and rinsing off  the CHG Soap.                9.  Pat yourself dry with a clean towel.            10.  Wear clean pajamas.            11.  Place clean sheets on your bed the night of your first shower and do not  sleep with pets. Day of Surgery : Do not apply any lotions/deodorants the morning of surgery.  Please wear clean clothes to the hospital/surgery center.  FAILURE TO FOLLOW THESE INSTRUCTIONS MAY RESULT IN THE CANCELLATION OF YOUR SURGERY PATIENT SIGNATURE_________________________________  NURSE SIGNATURE__________________________________  ________________________________________________________________________

## 2020-05-03 ENCOUNTER — Other Ambulatory Visit: Payer: Self-pay

## 2020-05-03 ENCOUNTER — Other Ambulatory Visit (HOSPITAL_COMMUNITY): Payer: Managed Care, Other (non HMO)

## 2020-05-03 ENCOUNTER — Other Ambulatory Visit (HOSPITAL_COMMUNITY)
Admission: RE | Admit: 2020-05-03 | Discharge: 2020-05-03 | Disposition: A | Discharge status: Home / Self Care | Payer: Managed Care, Other (non HMO) | Source: Ambulatory Visit | Attending: Orthopaedic Surgery | Admitting: Orthopaedic Surgery

## 2020-05-03 ENCOUNTER — Encounter (HOSPITAL_COMMUNITY): Payer: Self-pay

## 2020-05-03 ENCOUNTER — Ambulatory Visit (HOSPITAL_COMMUNITY)
Admission: RE | Admit: 2020-05-03 | Discharge: 2020-05-03 | Disposition: A | Discharge status: Home / Self Care | Payer: Managed Care, Other (non HMO) | Source: Ambulatory Visit | Attending: Physician Assistant | Admitting: Physician Assistant

## 2020-05-03 ENCOUNTER — Encounter (HOSPITAL_COMMUNITY)
Admission: RE | Admit: 2020-05-03 | Discharge: 2020-05-03 | Disposition: A | Discharge status: Home / Self Care | Payer: Managed Care, Other (non HMO) | Source: Ambulatory Visit | Attending: Orthopaedic Surgery | Admitting: Orthopaedic Surgery

## 2020-05-03 ENCOUNTER — Telehealth: Payer: Self-pay | Admitting: Orthopaedic Surgery

## 2020-05-03 DIAGNOSIS — Z20822 Contact with and (suspected) exposure to covid-19: Secondary | ICD-10-CM | POA: Insufficient documentation

## 2020-05-03 DIAGNOSIS — Z01818 Encounter for other preprocedural examination: Secondary | ICD-10-CM | POA: Insufficient documentation

## 2020-05-03 DIAGNOSIS — M1611 Unilateral primary osteoarthritis, right hip: Secondary | ICD-10-CM | POA: Insufficient documentation

## 2020-05-03 HISTORY — DX: Unspecified osteoarthritis, unspecified site: M19.90

## 2020-05-03 LAB — CBC WITH DIFFERENTIAL/PLATELET
Abs Immature Granulocytes: 0.05 10*3/uL (ref 0.00–0.07)
Basophils Absolute: 0 10*3/uL (ref 0.0–0.1)
Basophils Relative: 0 %
Eosinophils Absolute: 0.2 10*3/uL (ref 0.0–0.5)
Eosinophils Relative: 1 %
HCT: 44.6 % (ref 39.0–52.0)
Hemoglobin: 15.6 g/dL (ref 13.0–17.0)
Immature Granulocytes: 0 %
Lymphocytes Relative: 22 %
Lymphs Abs: 2.6 10*3/uL (ref 0.7–4.0)
MCH: 29 pg (ref 26.0–34.0)
MCHC: 35 g/dL (ref 30.0–36.0)
MCV: 82.9 fL (ref 80.0–100.0)
Monocytes Absolute: 1.2 10*3/uL — ABNORMAL HIGH (ref 0.1–1.0)
Monocytes Relative: 10 %
Neutro Abs: 7.9 10*3/uL — ABNORMAL HIGH (ref 1.7–7.7)
Neutrophils Relative %: 67 %
Platelets: 380 10*3/uL (ref 150–400)
RBC: 5.38 MIL/uL (ref 4.22–5.81)
RDW: 12.8 % (ref 11.5–15.5)
WBC: 12 10*3/uL — ABNORMAL HIGH (ref 4.0–10.5)
nRBC: 0 % (ref 0.0–0.2)

## 2020-05-03 LAB — URINALYSIS, ROUTINE W REFLEX MICROSCOPIC
Bilirubin Urine: NEGATIVE
Glucose, UA: NEGATIVE mg/dL
Hgb urine dipstick: NEGATIVE
Ketones, ur: NEGATIVE mg/dL
Leukocytes,Ua: NEGATIVE
Nitrite: NEGATIVE
Protein, ur: NEGATIVE mg/dL
Specific Gravity, Urine: 1.017 (ref 1.005–1.030)
pH: 7 (ref 5.0–8.0)

## 2020-05-03 LAB — SURGICAL PCR SCREEN
MRSA, PCR: NEGATIVE
Staphylococcus aureus: NEGATIVE

## 2020-05-03 LAB — COMPREHENSIVE METABOLIC PANEL
ALT: 50 U/L — ABNORMAL HIGH (ref 0–44)
AST: 38 U/L (ref 15–41)
Albumin: 4.4 g/dL (ref 3.5–5.0)
Alkaline Phosphatase: 154 U/L — ABNORMAL HIGH (ref 38–126)
Anion gap: 10 (ref 5–15)
BUN: 8 mg/dL (ref 6–20)
CO2: 27 mmol/L (ref 22–32)
Calcium: 9.5 mg/dL (ref 8.9–10.3)
Chloride: 103 mmol/L (ref 98–111)
Creatinine, Ser: 0.95 mg/dL (ref 0.61–1.24)
GFR calc Af Amer: >60 mL/min (ref >60)
GFR calc non Af Amer: >60 mL/min (ref >60)
Glucose, Bld: 103 mg/dL — ABNORMAL HIGH (ref 70–99)
Potassium: 3.7 mmol/L (ref 3.5–5.1)
Sodium: 140 mmol/L (ref 135–145)
Total Bilirubin: 0.7 mg/dL (ref 0.3–1.2)
Total Protein: 7.6 g/dL (ref 6.5–8.1)

## 2020-05-03 LAB — APTT: aPTT: 27 seconds (ref 24–36)

## 2020-05-03 LAB — PROTIME-INR
INR: 0.9 (ref 0.8–1.2)
Prothrombin Time: 12.2 seconds (ref 11.4–15.2)

## 2020-05-03 LAB — SARS CORONAVIRUS 2 (TAT 6-24 HRS): SARS Coronavirus 2: NEGATIVE

## 2020-05-03 LAB — PREALBUMIN: Prealbumin: 30.3 mg/dL (ref 18–38)

## 2020-05-03 NOTE — Progress Notes (Signed)
Called patient to inform patient of time change for surgery on 05/06/20. To arrive 0530 for 0730 surgery. Finish ERAS drink by 0430. He verbalizes understanding.

## 2020-05-03 NOTE — Telephone Encounter (Signed)
[place] forms received. Sent to [employer] 

## 2020-05-05 MED ORDER — TRANEXAMIC ACID 1000 MG/10ML IV SOLN
2000.0000 mg | INTRAVENOUS | Status: DC
  Filled 2020-05-05: qty 20

## 2020-05-05 MED ORDER — BUPIVACAINE LIPOSOME 1.3 % IJ SUSP
10.0000 mL | Freq: Once | INTRAMUSCULAR | Status: DC
  Filled 2020-05-05: qty 10

## 2020-05-06 ENCOUNTER — Encounter (HOSPITAL_COMMUNITY)
Admission: RE | Disposition: A | Discharge status: Home / Self Care | Payer: Self-pay | Source: Home / Self Care | Attending: Orthopaedic Surgery

## 2020-05-06 ENCOUNTER — Observation Stay (HOSPITAL_COMMUNITY): Payer: Managed Care, Other (non HMO)

## 2020-05-06 ENCOUNTER — Other Ambulatory Visit: Payer: Self-pay

## 2020-05-06 ENCOUNTER — Ambulatory Visit (HOSPITAL_COMMUNITY): Payer: Managed Care, Other (non HMO) | Admitting: Certified Registered"

## 2020-05-06 ENCOUNTER — Ambulatory Visit (HOSPITAL_COMMUNITY): Payer: Managed Care, Other (non HMO)

## 2020-05-06 ENCOUNTER — Telehealth: Payer: Self-pay | Admitting: Orthopaedic Surgery

## 2020-05-06 ENCOUNTER — Observation Stay (HOSPITAL_COMMUNITY)
Admission: RE | Admit: 2020-05-06 | Discharge: 2020-05-07 | Disposition: A | Discharge status: Home / Self Care | Payer: Managed Care, Other (non HMO) | Attending: Orthopaedic Surgery | Admitting: Orthopaedic Surgery

## 2020-05-06 ENCOUNTER — Telehealth: Payer: Self-pay

## 2020-05-06 ENCOUNTER — Encounter (HOSPITAL_COMMUNITY): Payer: Self-pay | Admitting: Orthopaedic Surgery

## 2020-05-06 DIAGNOSIS — Z419 Encounter for procedure for purposes other than remedying health state, unspecified: Secondary | ICD-10-CM

## 2020-05-06 DIAGNOSIS — M1611 Unilateral primary osteoarthritis, right hip: Principal | ICD-10-CM | POA: Diagnosis present

## 2020-05-06 DIAGNOSIS — Z96641 Presence of right artificial hip joint: Secondary | ICD-10-CM

## 2020-05-06 DIAGNOSIS — Z7982 Long term (current) use of aspirin: Secondary | ICD-10-CM | POA: Diagnosis not present

## 2020-05-06 DIAGNOSIS — I10 Essential (primary) hypertension: Secondary | ICD-10-CM | POA: Insufficient documentation

## 2020-05-06 DIAGNOSIS — Z96649 Presence of unspecified artificial hip joint: Secondary | ICD-10-CM

## 2020-05-06 HISTORY — PX: TOTAL HIP ARTHROPLASTY: SHX124

## 2020-05-06 LAB — TYPE AND SCREEN
ABO/RH(D): B POS
Antibody Screen: NEGATIVE

## 2020-05-06 LAB — ABO/RH: ABO/RH(D): B POS

## 2020-05-06 SURGERY — ARTHROPLASTY, HIP, TOTAL, ANTERIOR APPROACH
Anesthesia: Spinal | Site: Hip | Laterality: Right

## 2020-05-06 MED ORDER — ATORVASTATIN CALCIUM 40 MG PO TABS
80.0000 mg | ORAL_TABLET | Freq: Every day | ORAL | Status: DC
  Administered 2020-05-07: 80 mg via ORAL
  Filled 2020-05-06: qty 2

## 2020-05-06 MED ORDER — AMISULPRIDE (ANTIEMETIC) 5 MG/2ML IV SOLN: 10.0000 mg | Freq: Once | INTRAVENOUS | Status: DC | PRN

## 2020-05-06 MED ORDER — OXYCODONE HCL ER 10 MG PO T12A
10.0000 mg | EXTENDED_RELEASE_TABLET | Freq: Two times a day (BID) | ORAL | 0 refills | Status: AC
Start: 2020-05-06 — End: ?

## 2020-05-06 MED ORDER — ACETAMINOPHEN 500 MG PO TABS
1000.0000 mg | ORAL_TABLET | Freq: Four times a day (QID) | ORAL | Status: AC
  Administered 2020-05-06 – 2020-05-07 (×4): 1000 mg via ORAL
  Filled 2020-05-06 (×4): qty 2

## 2020-05-06 MED ORDER — BUPIVACAINE LIPOSOME 1.3 % IJ SUSP: 10.0000 mL | Freq: Once | INTRAMUSCULAR | Status: DC

## 2020-05-06 MED ORDER — FENTANYL CITRATE (PF) 100 MCG/2ML IJ SOLN
INTRAMUSCULAR | Status: DC | PRN
Start: 2020-05-06 — End: 2020-05-06
  Administered 2020-05-06: 50 ug via INTRAVENOUS
  Administered 2020-05-06 (×2): 25 ug via INTRAVENOUS

## 2020-05-06 MED ORDER — PROPOFOL 1000 MG/100ML IV EMUL
INTRAVENOUS | Status: AC
  Filled 2020-05-06: qty 100

## 2020-05-06 MED ORDER — OXYCODONE HCL 5 MG PO TABS: 5.0000 mg | ORAL_TABLET | Freq: Once | ORAL | Status: DC | PRN

## 2020-05-06 MED ORDER — ACETAMINOPHEN 160 MG/5ML PO SOLN: 325.0000 mg | Freq: Once | ORAL | Status: DC | PRN

## 2020-05-06 MED ORDER — METOCLOPRAMIDE HCL 5 MG PO TABS: 5.0000 mg | ORAL_TABLET | Freq: Three times a day (TID) | ORAL | Status: DC | PRN

## 2020-05-06 MED ORDER — ACETAMINOPHEN 325 MG PO TABS: 325.0000 mg | ORAL_TABLET | Freq: Four times a day (QID) | ORAL | Status: DC | PRN

## 2020-05-06 MED ORDER — MAGNESIUM CITRATE PO SOLN: 1.0000 | Freq: Once | ORAL | Status: DC | PRN

## 2020-05-06 MED ORDER — OXYCODONE HCL 5 MG PO TABS
10.0000 mg | ORAL_TABLET | ORAL | Status: DC | PRN
  Filled 2020-05-06: qty 2

## 2020-05-06 MED ORDER — METHOCARBAMOL 500 MG PO TABS: 500.0000 mg | ORAL_TABLET | Freq: Four times a day (QID) | ORAL | 0 refills | Status: AC

## 2020-05-06 MED ORDER — HYDROMORPHONE HCL 1 MG/ML IJ SOLN: 0.5000 mg | INTRAMUSCULAR | Status: DC | PRN

## 2020-05-06 MED ORDER — DEXAMETHASONE SODIUM PHOSPHATE 10 MG/ML IJ SOLN
INTRAMUSCULAR | Status: AC
  Filled 2020-05-06: qty 1

## 2020-05-06 MED ORDER — ALUM & MAG HYDROXIDE-SIMETH 200-200-20 MG/5ML PO SUSP: 30.0000 mL | ORAL | Status: DC | PRN

## 2020-05-06 MED ORDER — DOCUSATE SODIUM 100 MG PO CAPS: 100.0000 mg | ORAL_CAPSULE | Freq: Every day | ORAL | 2 refills | Status: AC | PRN

## 2020-05-06 MED ORDER — BUPIVACAINE IN DEXTROSE 0.75-8.25 % IT SOLN
INTRATHECAL | Status: DC | PRN
  Administered 2020-05-06: 1.8 mL via INTRATHECAL

## 2020-05-06 MED ORDER — LIDOCAINE 2% (20 MG/ML) 5 ML SYRINGE
INTRAMUSCULAR | Status: AC
  Filled 2020-05-06: qty 5

## 2020-05-06 MED ORDER — LACTATED RINGERS IV SOLN: INTRAVENOUS | Status: DC

## 2020-05-06 MED ORDER — AMLODIPINE BESYLATE 10 MG PO TABS
10.0000 mg | ORAL_TABLET | Freq: Every day | ORAL | Status: DC
  Administered 2020-05-07: 10 mg via ORAL
  Filled 2020-05-06: qty 1

## 2020-05-06 MED ORDER — ISOPROPYL ALCOHOL 70 % SOLN
Status: AC
  Filled 2020-05-06: qty 480

## 2020-05-06 MED ORDER — POLYETHYLENE GLYCOL 3350 17 G PO PACK: 17.0000 g | PACK | Freq: Every day | ORAL | Status: DC | PRN

## 2020-05-06 MED ORDER — DOCUSATE SODIUM 100 MG PO CAPS
100.0000 mg | ORAL_CAPSULE | Freq: Two times a day (BID) | ORAL | Status: DC
  Administered 2020-05-06 – 2020-05-07 (×3): 100 mg via ORAL
  Filled 2020-05-06 (×3): qty 1

## 2020-05-06 MED ORDER — ONDANSETRON HCL 4 MG PO TABS: 4.0000 mg | ORAL_TABLET | Freq: Four times a day (QID) | ORAL | Status: DC | PRN

## 2020-05-06 MED ORDER — OXYCODONE HCL ER 10 MG PO T12A
10.0000 mg | EXTENDED_RELEASE_TABLET | Freq: Two times a day (BID) | ORAL | Status: DC
  Administered 2020-05-06: 10 mg via ORAL
  Filled 2020-05-06 (×3): qty 1

## 2020-05-06 MED ORDER — MIDAZOLAM HCL 2 MG/2ML IJ SOLN
INTRAMUSCULAR | Status: DC | PRN
  Administered 2020-05-06: 2 mg via INTRAVENOUS

## 2020-05-06 MED ORDER — GABAPENTIN 300 MG PO CAPS
300.0000 mg | ORAL_CAPSULE | Freq: Three times a day (TID) | ORAL | Status: DC
  Administered 2020-05-06 – 2020-05-07 (×4): 300 mg via ORAL
  Filled 2020-05-06 (×4): qty 1

## 2020-05-06 MED ORDER — PROPOFOL 10 MG/ML IV BOLUS
INTRAVENOUS | Status: DC | PRN
  Administered 2020-05-06: 20 mg via INTRAVENOUS
  Administered 2020-05-06 (×2): 10 mg via INTRAVENOUS
  Administered 2020-05-06: 20 mg via INTRAVENOUS

## 2020-05-06 MED ORDER — PHENOL 1.4 % MT LIQD: 1.0000 | OROMUCOSAL | Status: DC | PRN

## 2020-05-06 MED ORDER — ASPIRIN EC 81 MG PO TBEC: 81.0000 mg | DELAYED_RELEASE_TABLET | Freq: Two times a day (BID) | ORAL | 0 refills | Status: AC

## 2020-05-06 MED ORDER — BUPIVACAINE HCL 0.25 % IJ SOLN
INTRAMUSCULAR | Status: AC
  Filled 2020-05-06: qty 1

## 2020-05-06 MED ORDER — IRRISEPT - 450ML BOTTLE WITH 0.05% CHG IN STERILE WATER, USP 99.95% OPTIME
TOPICAL | Status: DC | PRN
  Administered 2020-05-06: 450 mL

## 2020-05-06 MED ORDER — TRANEXAMIC ACID-NACL 1000-0.7 MG/100ML-% IV SOLN
1000.0000 mg | INTRAVENOUS | Status: AC
  Administered 2020-05-06: 1000 mg via INTRAVENOUS
  Filled 2020-05-06: qty 100

## 2020-05-06 MED ORDER — HYDROGEN PEROXIDE 3 % EX SOLN
CUTANEOUS | Status: AC
  Filled 2020-05-06: qty 473

## 2020-05-06 MED ORDER — VANCOMYCIN HCL 1 G IV SOLR
INTRAVENOUS | Status: DC | PRN
  Administered 2020-05-06: 1000 mg via TOPICAL

## 2020-05-06 MED ORDER — FENTANYL CITRATE (PF) 100 MCG/2ML IJ SOLN
INTRAMUSCULAR | Status: AC
  Filled 2020-05-06: qty 2

## 2020-05-06 MED ORDER — CEFAZOLIN SODIUM-DEXTROSE 2-4 GM/100ML-% IV SOLN
2.0000 g | INTRAVENOUS | Status: AC
  Administered 2020-05-06: 2 g via INTRAVENOUS
  Filled 2020-05-06: qty 100

## 2020-05-06 MED ORDER — ONDANSETRON HCL 4 MG PO TABS: 4.0000 mg | ORAL_TABLET | Freq: Three times a day (TID) | ORAL | 0 refills | Status: AC | PRN

## 2020-05-06 MED ORDER — HYDROMORPHONE HCL 1 MG/ML IJ SOLN: 0.2500 mg | INTRAMUSCULAR | Status: DC | PRN

## 2020-05-06 MED ORDER — ONDANSETRON HCL 4 MG/2ML IJ SOLN
INTRAMUSCULAR | Status: DC | PRN
  Administered 2020-05-06: 4 mg via INTRAVENOUS

## 2020-05-06 MED ORDER — SODIUM CHLORIDE 0.9 % IR SOLN
Status: DC | PRN
  Administered 2020-05-06: 3000 mL

## 2020-05-06 MED ORDER — MIDAZOLAM HCL 2 MG/2ML IJ SOLN
INTRAMUSCULAR | Status: AC
  Filled 2020-05-06: qty 2

## 2020-05-06 MED ORDER — PROPOFOL 500 MG/50ML IV EMUL
INTRAVENOUS | Status: DC | PRN
  Administered 2020-05-06: 50 ug/kg/min via INTRAVENOUS

## 2020-05-06 MED ORDER — 0.9 % SODIUM CHLORIDE (POUR BTL) OPTIME
TOPICAL | Status: DC | PRN
  Administered 2020-05-06: 1000 mL

## 2020-05-06 MED ORDER — SODIUM CHLORIDE (PF) 0.9 % IJ SOLN
INTRAMUSCULAR | Status: DC | PRN
  Administered 2020-05-06: 20 mL

## 2020-05-06 MED ORDER — SODIUM CHLORIDE 0.9 % IV SOLN: INTRAVENOUS | Status: DC

## 2020-05-06 MED ORDER — PROPOFOL 10 MG/ML IV BOLUS
INTRAVENOUS | Status: AC
  Filled 2020-05-06: qty 20

## 2020-05-06 MED ORDER — CEFAZOLIN SODIUM-DEXTROSE 2-4 GM/100ML-% IV SOLN
2.0000 g | Freq: Four times a day (QID) | INTRAVENOUS | Status: AC
  Administered 2020-05-06 – 2020-05-07 (×3): 2 g via INTRAVENOUS
  Filled 2020-05-06 (×3): qty 100

## 2020-05-06 MED ORDER — OXYCODONE HCL 5 MG/5ML PO SOLN: 5.0000 mg | Freq: Once | ORAL | Status: DC | PRN

## 2020-05-06 MED ORDER — DIPHENHYDRAMINE HCL 12.5 MG/5ML PO ELIX: 25.0000 mg | ORAL_SOLUTION | ORAL | Status: DC | PRN

## 2020-05-06 MED ORDER — SODIUM CHLORIDE (PF) 0.9 % IJ SOLN
INTRAMUSCULAR | Status: AC
  Filled 2020-05-06: qty 20

## 2020-05-06 MED ORDER — DEXAMETHASONE SODIUM PHOSPHATE 10 MG/ML IJ SOLN
10.0000 mg | Freq: Once | INTRAMUSCULAR | Status: AC
  Administered 2020-05-07: 10 mg via INTRAVENOUS
  Filled 2020-05-06: qty 1

## 2020-05-06 MED ORDER — ACETAMINOPHEN 10 MG/ML IV SOLN: 1000.0000 mg | Freq: Once | INTRAVENOUS | Status: DC | PRN

## 2020-05-06 MED ORDER — METOCLOPRAMIDE HCL 5 MG/ML IJ SOLN: 5.0000 mg | Freq: Three times a day (TID) | INTRAMUSCULAR | Status: DC | PRN

## 2020-05-06 MED ORDER — TRANEXAMIC ACID 1000 MG/10ML IV SOLN
INTRAVENOUS | Status: DC | PRN
  Administered 2020-05-06: 2000 mg via TOPICAL

## 2020-05-06 MED ORDER — BUPIVACAINE HCL (PF) 0.25 % IJ SOLN
INTRAMUSCULAR | Status: DC | PRN
  Administered 2020-05-06: 20 mL

## 2020-05-06 MED ORDER — OXYCODONE HCL 5 MG PO TABS
5.0000 mg | ORAL_TABLET | ORAL | Status: DC | PRN
  Administered 2020-05-06 (×2): 10 mg via ORAL
  Filled 2020-05-06 (×2): qty 2

## 2020-05-06 MED ORDER — MEPERIDINE HCL 50 MG/ML IJ SOLN: 6.2500 mg | INTRAMUSCULAR | Status: DC | PRN

## 2020-05-06 MED ORDER — OXYCODONE-ACETAMINOPHEN 5-325 MG PO TABS: 1.0000 | ORAL_TABLET | Freq: Four times a day (QID) | ORAL | 0 refills | Status: AC | PRN

## 2020-05-06 MED ORDER — MENTHOL 3 MG MT LOZG: 1.0000 | LOZENGE | OROMUCOSAL | Status: DC | PRN

## 2020-05-06 MED ORDER — ONDANSETRON HCL 4 MG/2ML IJ SOLN: 4.0000 mg | Freq: Four times a day (QID) | INTRAMUSCULAR | Status: DC | PRN

## 2020-05-06 MED ORDER — BUPIVACAINE LIPOSOME 1.3 % IJ SUSP
INTRAMUSCULAR | Status: DC | PRN
  Administered 2020-05-06: 20 mL

## 2020-05-06 MED ORDER — DEXAMETHASONE SODIUM PHOSPHATE 10 MG/ML IJ SOLN
INTRAMUSCULAR | Status: DC | PRN
  Administered 2020-05-06: 8 mg via INTRAVENOUS

## 2020-05-06 MED ORDER — ALLOPURINOL 100 MG PO TABS
100.0000 mg | ORAL_TABLET | Freq: Two times a day (BID) | ORAL | Status: DC
  Administered 2020-05-06 – 2020-05-07 (×2): 100 mg via ORAL
  Filled 2020-05-06 (×2): qty 1

## 2020-05-06 MED ORDER — CHLORHEXIDINE GLUCONATE 0.12 % MT SOLN
15.0000 mL | Freq: Once | OROMUCOSAL | Status: AC
  Administered 2020-05-06: 15 mL via OROMUCOSAL

## 2020-05-06 MED ORDER — KETOROLAC TROMETHAMINE 15 MG/ML IJ SOLN
15.0000 mg | Freq: Four times a day (QID) | INTRAMUSCULAR | Status: AC
  Administered 2020-05-06 – 2020-05-07 (×4): 15 mg via INTRAVENOUS
  Filled 2020-05-06 (×4): qty 1

## 2020-05-06 MED ORDER — ONDANSETRON HCL 4 MG/2ML IJ SOLN
INTRAMUSCULAR | Status: AC
  Filled 2020-05-06: qty 2

## 2020-05-06 MED ORDER — ACETAMINOPHEN 325 MG PO TABS: 325.0000 mg | ORAL_TABLET | Freq: Once | ORAL | Status: DC | PRN

## 2020-05-06 MED ORDER — POVIDONE-IODINE 10 % EX SWAB
2.0000 "application " | Freq: Once | CUTANEOUS | Status: AC
  Administered 2020-05-06: 2 via TOPICAL

## 2020-05-06 MED ORDER — SORBITOL 70 % SOLN
30.0000 mL | Freq: Every day | Status: DC | PRN
  Filled 2020-05-06: qty 30

## 2020-05-06 MED ORDER — ASPIRIN 81 MG PO CHEW
81.0000 mg | CHEWABLE_TABLET | Freq: Two times a day (BID) | ORAL | Status: DC
  Administered 2020-05-06 – 2020-05-07 (×2): 81 mg via ORAL
  Filled 2020-05-06 (×2): qty 1

## 2020-05-06 MED ORDER — ORAL CARE MOUTH RINSE: 15.0000 mL | Freq: Once | OROMUCOSAL | Status: AC

## 2020-05-06 SURGICAL SUPPLY — 61 items
ACETAB CUP W/GRIPTION 54 (Plate) ×2 IMPLANT
BAG SPEC THK2 15X12 ZIP CLS (MISCELLANEOUS) ×1
BAG ZIPLOCK 12X15 (MISCELLANEOUS) ×2 IMPLANT
CELLS DAT CNTRL 66122 CELL SVR (MISCELLANEOUS) ×1 IMPLANT
COVER PERINEAL POST (MISCELLANEOUS) ×2 IMPLANT
COVER SURGICAL LIGHT HANDLE (MISCELLANEOUS) ×2 IMPLANT
CUP ACETAB W/GRIPTION 54 (Plate) IMPLANT
DECANTER SPIKE VIAL GLASS SM (MISCELLANEOUS) ×2 IMPLANT
DRAPE IMP U-DRAPE 54X76 (DRAPES) ×4 IMPLANT
DRAPE POUCH INSTRU U-SHP 10X18 (DRAPES) ×2 IMPLANT
DRAPE STERI IOBAN 125X83 (DRAPES) ×2 IMPLANT
DRAPE U-SHAPE 47X51 STRL (DRAPES) ×4 IMPLANT
DRESSING AQUACEL AG SP 3.5X6 (GAUZE/BANDAGES/DRESSINGS) IMPLANT
DRSG AQUACEL AG SP 3.5X6 (GAUZE/BANDAGES/DRESSINGS) ×2
DRSG MEPILEX BORDER 4X8 (GAUZE/BANDAGES/DRESSINGS) ×2 IMPLANT
DURAPREP 26ML APPLICATOR (WOUND CARE) ×4 IMPLANT
ELECT BLADE TIP CTD 4 INCH (ELECTRODE) ×2 IMPLANT
ELECT REM PT RETURN 15FT ADLT (MISCELLANEOUS) ×2 IMPLANT
FACESHIELD WRAPAROUND (MASK) ×2 IMPLANT
FACESHIELD WRAPAROUND OR TEAM (MASK) IMPLANT
GLOVE BIOGEL PI IND STRL 7.0 (GLOVE) ×1 IMPLANT
GLOVE BIOGEL PI INDICATOR 7.0 (GLOVE) ×1
GLOVE ECLIPSE 7.0 STRL STRAW (GLOVE) ×5 IMPLANT
GLOVE SKINSENSE NS SZ7.5 (GLOVE) ×1
GLOVE SKINSENSE STRL SZ7.5 (GLOVE) ×1 IMPLANT
GLOVE SURG SYN 7.5  E (GLOVE) ×8
GLOVE SURG SYN 7.5 E (GLOVE) ×4 IMPLANT
GLOVE SURG SYN 7.5 PF PI (GLOVE) ×4 IMPLANT
GOWN STRL REIN XL XLG (GOWN DISPOSABLE) ×4 IMPLANT
GOWN STRL REUS W/TWL LRG LVL3 (GOWN DISPOSABLE) ×2 IMPLANT
HANDPIECE INTERPULSE COAX TIP (DISPOSABLE) ×2
HEAD CERAMIC 36 PLUS5 (Hips) ×1 IMPLANT
HOOD PEEL AWAY FLYTE STAYCOOL (MISCELLANEOUS) ×6 IMPLANT
JET LAVAGE IRRISEPT WOUND (IRRIGATION / IRRIGATOR) ×2
KIT BASIN OR (CUSTOM PROCEDURE TRAY) ×2 IMPLANT
KIT TURNOVER KIT A (KITS) ×2 IMPLANT
LAVAGE JET IRRISEPT WOUND (IRRIGATION / IRRIGATOR) ×1 IMPLANT
LINER NEUTRAL 54X36MM PLUS 4 (Hips) ×1 IMPLANT
MARKER SKIN DUAL TIP RULER LAB (MISCELLANEOUS) ×2 IMPLANT
NDL SPNL 18GX3.5 QUINCKE PK (NEEDLE) ×1 IMPLANT
NEEDLE SPNL 18GX3.5 QUINCKE PK (NEEDLE) ×2 IMPLANT
PACK ANTERIOR HIP CUSTOM (KITS) ×2 IMPLANT
PACK UNIVERSAL I (CUSTOM PROCEDURE TRAY) ×2 IMPLANT
PENCIL SMOKE EVACUATOR (MISCELLANEOUS) IMPLANT
RETRACTOR WND ALEXIS 18 MED (MISCELLANEOUS) ×1 IMPLANT
RTRCTR WOUND ALEXIS 18CM MED (MISCELLANEOUS) ×2
SAW OSC TIP CART 19.5X105X1.3 (SAW) ×2 IMPLANT
SCREW 6.5MMX25MM (Screw) ×1 IMPLANT
SET HNDPC FAN SPRY TIP SCT (DISPOSABLE) ×1 IMPLANT
STEM FEM ACTIS HIGH SZ3 (Stem) ×2 IMPLANT
SUT ETHIBOND 2 V 37 (SUTURE) ×2 IMPLANT
SUT MNCRL AB 3-0 PS2 18 (SUTURE) ×1 IMPLANT
SUT VIC AB 0 CT1 36 (SUTURE) ×2 IMPLANT
SUT VIC AB 1 CTX 36 (SUTURE) ×2
SUT VIC AB 1 CTX36XBRD ANBCTR (SUTURE) ×1 IMPLANT
SUT VIC AB 2-0 CT1 27 (SUTURE) ×2
SUT VIC AB 2-0 CT1 TAPERPNT 27 (SUTURE) ×1 IMPLANT
TOWEL OR 17X26 10 PK STRL BLUE (TOWEL DISPOSABLE) ×2 IMPLANT
TOWEL OR NON WOVEN STRL DISP B (DISPOSABLE) ×2 IMPLANT
TRAY FOLEY MTR SLVR 16FR STAT (SET/KITS/TRAYS/PACK) ×1 IMPLANT
YANKAUER SUCT BULB TIP NO VENT (SUCTIONS) ×4 IMPLANT

## 2020-05-06 NOTE — Anesthesia Postprocedure Evaluation (Signed)
Anesthesia Post Note  Patient: Major J Zartman  Procedure(s) Performed: RIGHT TOTAL HIP ARTHROPLASTY ANTERIOR APPROACH (Right Hip)     Patient location during evaluation: PACU Anesthesia Type: Spinal and MAC Level of consciousness: oriented and awake and alert Pain management: pain level controlled Vital Signs Assessment: post-procedure vital signs reviewed and stable Respiratory status: spontaneous breathing and respiratory function stable Cardiovascular status: blood pressure returned to baseline and stable Postop Assessment: no headache, no backache, no apparent nausea or vomiting and spinal receding Anesthetic complications: no   No complications documented.  Last Vitals:  Vitals:   05/06/20 0954 05/06/20 1000  BP: 133/66 135/77  Pulse: 88 79  Resp: 13 16  Temp: 36.6 C   SpO2: 100% 99%    Last Pain:  Vitals:   05/06/20 0954  TempSrc:   PainSc: 0-No pain    LLE Motor Response: Purposeful movement (05/06/20 0954)   RLE Motor Response: Purposeful movement (05/06/20 0954)   L Sensory Level: L3-Anterior knee, lower leg (05/06/20 0954) R Sensory Level: L4-Anterior knee, lower leg (05/06/20 0954)  Pervis Hocking

## 2020-05-06 NOTE — Telephone Encounter (Signed)
Pharmacist Jeanie Cooks from [facility] requesting a call back. Pharmacist has a couple of question about 2 medication refills. Please call Carl Davis at [phone].

## 2020-05-06 NOTE — Op Note (Signed)
RIGHT TOTAL HIP ARTHROPLASTY ANTERIOR APPROACH  Procedure Note Carl Davis   [id number]  Pre-op Diagnosis: right hip degenerative joint disease     Post-op Diagnosis: same   Operative Procedures  1. Total hip replacement; Right hip; uncemented [id number]   Personnel  Surgeon(s): Tarry Kos, MD  Assist: Oneal Grout, PA-C; necessary for the timely completion of procedure and due to complexity of procedure.   Anesthesia: spinal, local  Prosthesis: Depuy Acetabulum: Pinnacle 54 mm Femur: Actis 3 HO Head: 36 mm size: +5 Liner: +4 neutral Bearing Type: ceramic on poly  Total Hip Arthroplasty (Anterior Approach) Op Note:  After informed consent was obtained and the operative extremity marked in the holding area, the patient was brought back to the operating room and placed supine on the HANA table. Next, the operative extremity was prepped and draped in normal sterile fashion. Surgical timeout occurred verifying patient identification, surgical site, surgical procedure and administration of antibiotics.  A modified anterior Smith-Peterson approach to the hip was performed, using the interval between tensor fascia lata and sartorius.  Dissection was carried bluntly down onto the anterior hip capsule. The lateral femoral circumflex vessels were identified and coagulated. A capsulotomy was performed and the capsular flaps tagged for later repair.  Of note, the hip joint was fairly tight.  The neck osteotomy was performed. The femoral head was removed, the acetabular rim was cleared of soft tissue and attention was turned to reaming the acetabulum.  Sequential reaming was performed under fluoroscopic guidance. We reamed to a size 53 mm, and then impacted the acetabular shell. A 25 mm cancellous screw was placed through the shell for added fixation.  The liner was then placed after irrigation and attention turned to the femur.  After placing the femoral hook, the leg was taken to  externally rotated, extended and adducted position taking care to perform soft tissue releases to allow for adequate mobilization of the femur. Soft tissue was cleared from the shoulder of the greater trochanter and the hook elevator used to improve exposure of the proximal femur. Sequential broaching performed up to a size 3. The cancellous bone quality was excellent.  Trial neck and head were placed. The leg was brought back up to neutral and the construct reduced.  Antibiotic irrigation was placed in the surgical wound and kept for at least 1 minute.  The position and sizing of components, offset and leg lengths were checked using fluoroscopy. Stability of the construct was checked in extension and external rotation without any subluxation or impingement of prosthesis with a +5 head. We dislocated the prosthesis, dropped the leg back into position, removed trial components, and irrigated copiously. The final stem and head was then placed, the leg brought back up, the system reduced and fluoroscopy used to verify positioning.  We irrigated, obtained hemostasis and closed the capsule using #2 ethibond suture.  One gram of vancomycin powder was placed in the surgical bed. A dilute solution of 20 cc of normal saline, 1.3% exparel, 0.25% bupivacaine was injected in the soft tissues.  One gram of topical tranexamic acid was injected into the joint.  The fascia was closed with #1 vicryl plus, the deep fat layer was closed with 0 vicryl, the subcutaneous layers closed with 2.0 Vicryl Plus and the skin closed with 3.0 monocryl and steri strips. A sterile dressing was applied. The patient was awakened in the operating room and taken to recovery in stable condition.  All sponge, needle, and instrument  counts were correct at the end of the case.   Position: supine  Complications: see description of procedure.  Time Out: performed   Drains/Packing: none  Estimated blood loss: see anesthesia record  Returned to  Recovery Room: in good condition.   Antibiotics: yes   Mechanical VTE (DVT) Prophylaxis: sequential compression devices, TED thigh-high  Chemical VTE (DVT) Prophylaxis: aspirin   Fluid Replacement: see anesthesia record  Specimens Removed: 1 to pathology   Sponge and Instrument Count Correct? yes   PACU: portable radiograph - low AP   Plan/RTC: Return in 2 weeks for staple removal. Weight Bearing/Load Lower Extremity: full  Hip precautions: none Suture Removal: 2 weeks   N. Glee Arvin, MD [facility] 9:22 AM   Implant Name Type Inv. Item Serial No. Manufacturer Lot No. LRB No. Used Action  ACETABULAR CUP Cathe Mons - [id number] Plate ACETABULAR CUP W GRIPTION  [employer] [id number] Right 1 Implanted  SCREW 6.5MMX25MM - [id number] Screw SCREW 6.5MMX25MM  [employer] [id number] Right 1 Implanted  LINER NEUTRAL 54X36MM PLUS 4 - [id number] Hips LINER NEUTRAL 54X36MM PLUS 4  [employer] [id number] Right 1 Implanted  STEM FEM ACTIS HIGH SZ3 - [id number] Stem STEM FEM ACTIS HIGH SZ3  [employer] [id number] Right 1 Implanted  HEAD CERAMIC 36 PLUS5 - [id number] Hips HEAD CERAMIC 36 PLUS5  [employer] [id number] Right 1 Implanted

## 2020-05-06 NOTE — Anesthesia Preprocedure Evaluation (Addendum)
Anesthesia Evaluation  Patient identified by MRN, date of birth, ID band Patient awake    Reviewed: Allergy & Precautions, NPO status , Patient's Chart, lab work & pertinent test results  Airway Mallampati: III  TM Distance: >3 FB Neck ROM: Full    Dental  (+) Missing, Dental Advisory Given   Pulmonary neg pulmonary ROS,    breath sounds clear to auscultation       Cardiovascular hypertension,  Rhythm:Regular Rate:Normal     Neuro/Psych negative neurological ROS  negative psych ROS   GI/Hepatic negative GI ROS, Neg liver ROS,   Endo/Other  negative endocrine ROS  Renal/GU negative Renal ROS     Musculoskeletal  (+) Arthritis ,   Abdominal (+) + obese,   Peds  Hematology negative hematology ROS (+)   Anesthesia Other Findings   Reproductive/Obstetrics                            Anesthesia Physical Anesthesia Plan  ASA: II  Anesthesia Plan: Spinal   Post-op Pain Management:    Induction: Intravenous  PONV Risk Score and Plan:   Airway Management Planned: Natural Airway and Simple Face Mask  Additional Equipment: None  Intra-op Plan:   Post-operative Plan:   Informed Consent: I have reviewed the patients History and Physical, chart, labs and discussed the procedure including the risks, benefits and alternatives for the proposed anesthesia with the patient or authorized representative who has indicated his/her understanding and acceptance.       Plan Discussed with: CRNA  Anesthesia Plan Comments: (Lab Results      Component                Value               Date                      WBC                      12.0 (H)            05/03/2020                HGB                      15.6                05/03/2020                HCT                      44.6                05/03/2020                MCV                      82.9                05/03/2020                PLT                       380                 05/03/2020  EKG: normal sinus rhythm. )        Anesthesia Quick Evaluation

## 2020-05-06 NOTE — Anesthesia Procedure Notes (Signed)
Procedure Name: MAC Date/Time: 05/06/2020 7:37 AM Performed by: Eben Burow, CRNA Pre-anesthesia Checklist: Patient identified, Emergency Drugs available, Suction available, Patient being monitored and Timeout performed Oxygen Delivery Method: Simple face mask Placement Confirmation: positive ETCO2

## 2020-05-06 NOTE — Telephone Encounter (Signed)
[facility] called needing diagnosis code for pain medication submitted today. Provided this information to pharmacist.

## 2020-05-06 NOTE — Discharge Instructions (Signed)

## 2020-05-06 NOTE — Evaluation (Signed)
Physical Therapy Evaluation Patient Details Name: Carl Davis MRN: [id number] DOB: 11-Jul-1966 Today's Date: 05/06/2020   History of Present Illness  Patient is a 54 y.o. male s/p Rt THA anterior approach on 05/06/20 with PMH significant for HLD, HTN, Gout, OA.     Clinical Impression  Jeiden GARYSON STELLY is a 54 y.o. male POD 0 s/p Rt THA. Patient reports independence with mobility at baseline. Patient is now limited by functional impairments (see PT problem list below) and requires min assist for transfers and gait with RW. Patient was able to ambulate ~50 feet with RW and min assist. Patient instructed in exercise to facilitate ROM and circulation. Patient will benefit from continued skilled PT interventions to address impairments and progress towards PLOF. Acute PT will follow to progress mobility and stair training in preparation for safe discharge home.     Follow Up Recommendations Follow surgeon's recommendation for DC plan and follow-up therapies;Home health PT    Equipment Recommendations  Rolling walker with 5" wheels;3in1 (PT)    Recommendations for Other Services       Precautions / Restrictions Precautions Precautions: Fall Restrictions Weight Bearing Restrictions: No RLE Weight Bearing: Weight bearing as tolerated      Mobility  Bed Mobility Overal bed mobility: Needs Assistance Bed Mobility: Supine to Sit     Supine to sit: Min assist;HOB elevated     General bed mobility comments: cues to use bed rail, assist required to bring Rt LE off EOB and scoot to EOB.  Transfers Overall transfer level: Needs assistance Equipment used: Rolling walker (2 wheeled) Transfers: Sit to/from Stand Sit to Stand: Min assist         General transfer comment: Cues for technique with RW, min assist for power up and to steady with rise from EOB.   Ambulation/Gait Ambulation/Gait assistance: Min assist Gait Distance (Feet): 50 Feet Assistive device: Rolling walker (2 wheeled) Gait  Pattern/deviations: Step-to pattern;Decreased stride length;Decreased stance time - right;Decreased weight shift to right Gait velocity: decr   General Gait Details: cues for step pattern and safe proximity to RW, no overt LOB throughout, min assist for walker management.  Stairs         Wheelchair Mobility    Modified Rankin (Stroke Patients Only)       Balance Overall balance assessment: Needs assistance Sitting-balance support: Feet supported Sitting balance-Leahy Scale: Good     Standing balance support: During functional activity;Bilateral upper extremity supported Standing balance-Leahy Scale: Fair            Pertinent Vitals/Pain Pain Assessment: 0-10 Pain Score: 2  Pain Location: Rt hip Pain Descriptors / Indicators: Aching;Discomfort Pain Intervention(s): Limited activity within patient's tolerance;Monitored during session;Premedicated before session;Repositioned    Home Living Family/patient expects to be discharged to:: Private residence Living Arrangements: Spouse/significant other;Children Available Help at Discharge: Family Type of Home: House Home Access: Stairs to enter Entrance Stairs-Rails: None Entrance Stairs-Number of Steps: 3 Home Layout: Two level;Able to live on main level with bedroom/bathroom;1/2 bath on main level (sometimes sleeps on couch on main level) Home Equipment: None      Prior Function Level of Independence: Independent         Comments: works full time at [employer]   Dominant Hand: Right    Extremity/Trunk Assessment   Upper Extremity Assessment Upper Extremity Assessment: Overall WFL for tasks assessed    Lower Extremity Assessment Lower Extremity Assessment: RLE deficits/detail RLE Deficits / Details: god  quad activation RLE Sensation: WNL RLE Coordination: WNL    Cervical / Trunk Assessment Cervical / Trunk Assessment: Normal  Communication   Communication: No difficulties  Cognition  Arousal/Alertness: Awake/alert Behavior During Therapy: WFL for tasks assessed/performed Overall Cognitive Status: Within Functional Limits for tasks assessed             General Comments      Exercises Total Joint Exercises Ankle Circles/Pumps: AROM;Both;20 reps;Seated Quad Sets: AROM;Right;5 reps;Seated Heel Slides: AROM;Right;5 reps;Seated   Assessment/Plan    PT Assessment Patient needs continued PT services  PT Problem List Decreased range of motion;Decreased strength;Decreased activity tolerance;Decreased balance;Decreased mobility;Decreased knowledge of use of DME;Decreased safety awareness;Pain       PT Treatment Interventions DME instruction;Gait training;Stair training;Functional mobility training;Therapeutic activities;Therapeutic exercise;Balance training;Patient/family education    PT Goals (Current goals can be found in the Care Plan section)  Acute Rehab PT Goals Patient Stated Goal: get home PT Goal Formulation: With patient Time For Goal Achievement: 05/13/20 Potential to Achieve Goals: Good    Frequency 7X/week   Barriers to discharge           AM-PAC PT "6 Clicks" Mobility  Outcome Measure Help needed turning from your back to your side while in a flat bed without using bedrails?: A Little Help needed moving from lying on your back to sitting on the side of a flat bed without using bedrails?: A Little Help needed moving to and from a bed to a chair (including a wheelchair)?: A Little Help needed standing up from a chair using your arms (e.g., wheelchair or bedside chair)?: A Little Help needed to walk in hospital room?: A Little Help needed climbing 3-5 steps with a railing? : A Little 6 Click Score: 18    End of Session Equipment Utilized During Treatment: Gait belt Activity Tolerance: Patient tolerated treatment well Patient left: in chair;with call bell/phone within reach;with chair alarm set Nurse Communication: Mobility status PT Visit  Diagnosis: Muscle weakness (generalized) (M62.81);Difficulty in walking, not elsewhere classified (R26.2)    Time: 7628-3151 PT Time Calculation (min) (ACUTE ONLY): 23 min   Charges:   PT Evaluation $PT Eval Low Complexity: 1 Low PT Treatments $Therapeutic Exercise: 8-22 mins        Wynn Maudlin, DPT Acute Rehabilitation Services  Office [phone] Pager [phone]  05/06/2020 3:55 PM

## 2020-05-06 NOTE — Transfer of Care (Signed)
Immediate Anesthesia Transfer of Care Note  Patient: Carl Davis  Procedure(s) Performed: RIGHT TOTAL HIP ARTHROPLASTY ANTERIOR APPROACH (Right Hip)  Patient Location: PACU  Anesthesia Type:Spinal  Level of Consciousness: awake and patient cooperative  Airway & Oxygen Therapy: Patient Spontanous Breathing and Patient connected to face mask oxygen  Post-op Assessment: Report given to RN and Post -op Vital signs reviewed and stable  Post vital signs: Reviewed and stable  Last Vitals:  Vitals Value Taken Time  BP 133/66 05/06/20 0953  Temp    Pulse 85 05/06/20 0955  Resp 20 05/06/20 0955  SpO2 100 % 05/06/20 0955  Vitals shown include unvalidated device data.  Last Pain:  Vitals:   05/06/20 0612  TempSrc:   PainSc: 2       Patients Stated Pain Goal: 4 (05/06/20 0612)  Complications: No complications documented.

## 2020-05-06 NOTE — Telephone Encounter (Signed)
I called pharmacy to check this and she states she is concerned of an overdose while taking both Oxycontin and Oxycodone and would not fill them without authorization from [place]  She states she is aware that he had replacement but it was a high MME

## 2020-05-06 NOTE — Telephone Encounter (Signed)
r 

## 2020-05-06 NOTE — H&P (Signed)
PREOPERATIVE H&P  Chief Complaint: right hip degenerative joint disease  HPI: Carl Davis is a 54 y.o. male who presents for surgical treatment of right hip degenerative joint disease.  He denies any changes in medical history.  Past Medical History:  Diagnosis Date  . Abdominal pain   . Allergic rhinitis   . Arthritis   . Chest pain   . Elevated LDL cholesterol level   . Gout   . HTN (hypertension)   . Hydrocele, bilateral   . Hyperlipidemia   . Joint pain   . Left varicocele   . Rectal bleeding    Past Surgical History:  Procedure Laterality Date  . NOSE SURGERY     Social History   Socioeconomic History  . Marital status: Married    Spouse name: Not on file  . Number of children: 3  . Years of education: 71  . Highest education level: Not on file  Occupational History  . Not on file  Tobacco Use  . Smoking status: Never Smoker  . Smokeless tobacco: Never Used  Vaping Use  . Vaping Use: Never used  Substance and Sexual Activity  . Alcohol use: No  . Drug use: No  . Sexual activity: Not on file  Other Topics Concern  . Not on file  Social History Narrative  . Not on file   Social Determinants of Health   Financial Resource Strain:   . Difficulty of Paying Living Expenses: Not on file  Food Insecurity:   . Worried About Programme researcher, broadcasting/film/video in the Last Year: Not on file  . Ran Out of Food in the Last Year: Not on file  Transportation Needs:   . Lack of Transportation (Medical): Not on file  . Lack of Transportation (Non-Medical): Not on file  Physical Activity:   . Days of Exercise per Week: Not on file  . Minutes of Exercise per Session: Not on file  Stress:   . Feeling of Stress : Not on file  Social Connections:   . Frequency of Communication with Friends and Family: Not on file  . Frequency of Social Gatherings with Friends and Family: Not on file  . Attends Religious Services: Not on file  . Active Member of Clubs or Organizations: Not on  file  . Attends Banker Meetings: Not on file  . Marital Status: Not on file   Family History  Problem Relation Age of Onset  . Heart attack Brother 32  . Diabetes Neg Hx   . Hypertension Neg Hx   . Kidney disease Neg Hx    Allergies  Allergen Reactions  . Penicillins Other (See Comments)    Patient does not recall    Prior to Admission medications   Medication Sig Start Date End Date Taking? Authorizing Provider  allopurinol (ZYLOPRIM) 100 MG tablet Take 100 mg by mouth 2 (two) times daily.    Yes [provider]  amLODipine (NORVASC) 10 MG tablet Take 10 mg by mouth daily.   Yes [provider]  atorvastatin (LIPITOR) 80 MG tablet Take 80 mg by mouth daily.   Yes [provider]  cetirizine (ZYRTEC) 10 MG tablet Take 10 mg by mouth daily.   Yes [provider]  ibuprofen (ADVIL,MOTRIN) 200 MG tablet Take 400 mg by mouth every 6 (six) hours as needed for headache or mild pain.   Yes [provider]  meloxicam (MOBIC) 7.5 MG tablet Take 1 tablet (7.5 mg  total) by mouth 2 (two) times daily as needed for pain. 12/01/19  Yes Tarry Kos, MD  aspirin EC 81 MG tablet Take 325 mg by mouth once. Patient not taking: Reported on 04/26/2020    [provider]  diclofenac (VOLTAREN) 75 MG EC tablet Take 1 tablet (75 mg total) by mouth 2 (two) times daily. Patient not taking: Reported on 04/26/2020 04/05/20   Tarry Kos, MD  Multiple Vitamin (MULTIVITAMIN) tablet Take 1 tablet by mouth daily. Patient not taking: Reported on 04/26/2020    [provider]     Positive ROS: All other systems have been reviewed and were otherwise negative with the exception of those mentioned in the HPI and as above.  Physical Exam: General: Alert, no acute distress Cardiovascular: No pedal edema Respiratory: No cyanosis, no use of accessory musculature GI: abdomen soft Skin: No lesions in the area of chief complaint Neurologic:  Sensation intact distally Psychiatric: Patient is competent for consent with normal mood and affect Lymphatic: no lymphedema  MUSCULOSKELETAL: exam stable  Assessment: right hip degenerative joint disease  Plan: Plan for Procedure(s): RIGHT TOTAL HIP ARTHROPLASTY ANTERIOR APPROACH  The risks benefits and alternatives were discussed with the patient including but not limited to the risks of nonoperative treatment, versus surgical intervention including infection, bleeding, nerve injury,  blood clots, cardiopulmonary complications, morbidity, mortality, among others, and they were willing to proceed.   Preoperative templating of the joint replacement has been completed, documented, and submitted to the Operating Room personnel in order to optimize intra-operative equipment management.   Glee Arvin, MD 05/06/2020 6:58 AM

## 2020-05-06 NOTE — Anesthesia Procedure Notes (Addendum)
Spinal  Patient location during procedure: OR Start time: 05/06/2020 7:37 AM End time: 05/06/2020 7:41 AM Staffing Performed: resident/CRNA  Anesthesiologist: Effie Berkshire, MD Resident/CRNA: Eben Burow, CRNA Preanesthetic Checklist Completed: patient identified, IV checked, site marked, risks and benefits discussed, surgical consent, monitors and equipment checked, pre-op evaluation and timeout performed Spinal Block Patient position: sitting Prep: DuraPrep and site prepped and draped Patient monitoring: heart rate, cardiac monitor, continuous pulse ox and blood pressure Approach: midline Location: L3-4 Injection technique: single-shot Needle Needle type: Pencan  Needle gauge: 24 G Needle length: 9 cm Assessment Sensory level: T4 Additional Notes Pt placed in sitting position, spinal kit expiration date checked and verified, + CSF, - heme, pt tolerated well.  Dr Smith Robert present and supervising throughout [place].

## 2020-05-07 ENCOUNTER — Encounter (HOSPITAL_COMMUNITY): Payer: Self-pay | Admitting: Orthopaedic Surgery

## 2020-05-07 DIAGNOSIS — M1611 Unilateral primary osteoarthritis, right hip: Secondary | ICD-10-CM | POA: Diagnosis not present

## 2020-05-07 LAB — CBC
HCT: 35.2 % — ABNORMAL LOW (ref 39.0–52.0)
Hemoglobin: 12.3 g/dL — ABNORMAL LOW (ref 13.0–17.0)
MCH: 29.4 pg (ref 26.0–34.0)
MCHC: 34.9 g/dL (ref 30.0–36.0)
MCV: 84 fL (ref 80.0–100.0)
Platelets: 356 10*3/uL (ref 150–400)
RBC: 4.19 MIL/uL — ABNORMAL LOW (ref 4.22–5.81)
RDW: 12.7 % (ref 11.5–15.5)
WBC: 25.2 10*3/uL — ABNORMAL HIGH (ref 4.0–10.5)
nRBC: 0 % (ref 0.0–0.2)

## 2020-05-07 LAB — BASIC METABOLIC PANEL
Anion gap: 10 (ref 5–15)
BUN: 16 mg/dL (ref 6–20)
CO2: 26 mmol/L (ref 22–32)
Calcium: 8.6 mg/dL — ABNORMAL LOW (ref 8.9–10.3)
Chloride: 100 mmol/L (ref 98–111)
Creatinine, Ser: 1.18 mg/dL (ref 0.61–1.24)
GFR calc Af Amer: >60 mL/min (ref >60)
GFR calc non Af Amer: >60 mL/min (ref >60)
Glucose, Bld: 231 mg/dL — ABNORMAL HIGH (ref 70–99)
Potassium: 4.3 mmol/L (ref 3.5–5.1)
Sodium: 136 mmol/L (ref 135–145)

## 2020-05-07 NOTE — Progress Notes (Signed)
Physical Therapy Treatment Patient Details Name: Carl Davis MRN: [id number] DOB: [birth year] Today's Date: 05/07/2020    History of Present Illness Patient is a 54 y.o. male s/p Rt THA anterior approach on 05/06/20 with PMH significant for HLD, HTN, Gout, OA.     PT Comments    POD # 1 am session Assisted OOB to amb in hallway and perform TE's following HEP handout.   Follow Up Recommendations  Follow surgeons recommendation for DC plan and follow-up therapies;Home health PT     Equipment Recommendations  Rolling walker with 5" wheels;3in1 (PT)    Recommendations for Other Services       Precautions / Restrictions Precautions Precautions: Fall Restrictions Weight Bearing Restrictions: No RLE Weight Bearing: Weight bearing as tolerated    Mobility  Bed Mobility Overal bed mobility: Needs Assistance Bed Mobility: Supine to Sit     Supine to sit: Modified independent (Device/Increase time)     General bed mobility comments: demonstarted an d instructed how to use belt loop to assist LE  Transfers Overall transfer level: Needs assistance Equipment used: Rolling walker (2 wheeled) Transfers: Sit to/from [employer] Sit to Stand: Supervision Stand pivot transfers: Supervision;Min guard       General transfer comment: 25% VC's on proper tech and safety with turns  Ambulation/Gait Ambulation/Gait assistance: Supervision;Min guard Gait Distance (Feet): 85 Feet Assistive device: Rolling walker (2 wheeled)   Gait velocity: decr   General Gait Details: 25% VC's on proper sequencing and proper walker to self distance.   Stairs             Wheelchair Mobility    Modified Rankin (Stroke Patients Only)       Balance                                            Cognition Arousal/Alertness: Awake/alert Behavior During Therapy: WFL for tasks assessed/performed Overall Cognitive Status: Within Functional Limits for tasks  assessed                                 General Comments: AxO x 3 motivated Advertising account planner Comments        Pertinent Vitals/Pain Pain Assessment: 0-10 Pain Score: 3  Pain Location: Rt hip Pain Descriptors / Indicators: Aching;Discomfort;Tender Pain Intervention(s): Monitored during session;Premedicated before session;Repositioned;Ice applied    Home Living                      Prior Function            PT Goals (current goals can now be found in the care plan section) Progress towards PT goals: Progressing toward goals    Frequency    7X/week      PT Plan Current plan remains appropriate    Co-evaluation              AM-PAC PT "6 Clicks" Mobility   Outcome Measure  Help needed turning from your back to your side while in a flat bed without using bedrails?: None Help needed moving from lying on your back to sitting on the side of a flat bed without using bedrails?: None Help needed moving to and from a bed to a chair (  including a wheelchair)?: None Help needed standing up from a chair using your arms (e.g., wheelchair or bedside chair)?: None Help needed to walk in hospital room?: A Little Help needed climbing 3-5 steps with a railing? : A Little 6 Click Score: 22    End of Session Equipment Utilized During Treatment: Gait belt Activity Tolerance: Patient tolerated treatment well Patient left: in chair;with call bell/phone within reach;with chair alarm set Nurse Communication: Mobility status (pt will need another PT session to complete HEP and address stairs) PT Visit Diagnosis: Muscle weakness (generalized) (M62.81);Difficulty in walking, not elsewhere classified (R26.2)     Time: 8333-8329 PT Time Calculation (min) (ACUTE ONLY): 25 min  Charges:  $Gait Training: 8-22 mins $Therapeutic Exercise: 8-22 mins                     Felecia Shelling  PTA Acute  Rehabilitation Services Pager       [phone] Office      [phone]

## 2020-05-07 NOTE — Telephone Encounter (Signed)
Just fill oxycodone

## 2020-05-07 NOTE — TOC Transition Note (Signed)
Transition of Care [facility]) - CM/SW Discharge Note   Patient Details  Name: KENDALE REMBOLD MRN: [id number] Date of Birth: 03-14-1966  Transition of Care [facility]) CM/SW Contact:  Lennart Pall, LCSW Phone Number: 05/07/2020, 9:55 AM   Clinical Narrative:    Met with pt to review dc needs.  Orders in for HHPT and DME and pt without any agency preferences.  See below for referral info.  No further TOC needs.   Final next level of care: [place] Barriers to Discharge: No Barriers Identified   Patient Goals and CMS Choice Patient states their goals for this hospitalization and ongoing recovery are:: go home today CMS Medicare.gov Compare Post Acute Care list provided to:: Patient Choice offered to / list presented to : Patient  Discharge Placement                       Discharge Plan and Services In-house Referral: Clinical Social Work   Post Acute Care Choice: Durable Medical Equipment, Home Health          DME Arranged: Gilford Rile rolling, 3-N-1 DME Agency: [employer] Date DME Agency Contacted: 05/07/20 Time DME Agency Contacted: [id number] Representative spoke with at DME Agency: Ovid Curd HH Arranged: PT [place]: Benjamin Date [place]: 05/07/20 Time [place]: [phone] Representative spoke with at [place]: [place]) Interventions     Readmission Risk Interventions No flowsheet data found.

## 2020-05-07 NOTE — Progress Notes (Signed)
Physical Therapy Treatment Patient Details Name: Carl Davis MRN: [id number] DOB: December 25, 1965 Today's Date: 05/07/2020    History of Present Illness Patient is a 54 y.o. male s/p Rt THA anterior approach on 05/06/20 with PMH significant for HLD, HTN, Gout, OA.     PT Comments    POD # 1 pm session Assisted with amb an increased distance Then returned to room to perform some TE's following HEP handout.  Instructed on proper tech, freq as well as use of ICE.  Addressed all mobility questions, discussed appropriate activity, educated on use of ICE.  Pt ready for D/C to home.    Follow Up Recommendations  Follow surgeon's recommendation for DC plan and follow-up therapies;Home health PT     Equipment Recommendations  Rolling walker with 5" wheels;3in1 (PT)    Recommendations for Other Services       Precautions / Restrictions Precautions Precautions: Fall Restrictions Weight Bearing Restrictions: No RLE Weight Bearing: Weight bearing as tolerated    Mobility  Bed Mobility Overal bed mobility: Needs Assistance Bed Mobility: Supine to Sit     Supine to sit: Modified independent (Device/Increase time)     General bed mobility comments: OOB in recliner  Transfers Overall transfer level: Needs assistance Equipment used: Rolling walker (2 wheeled) Transfers: Sit to/from [employer] Sit to Stand: Supervision Stand pivot transfers: Supervision;Min guard       General transfer comment: 25% VC's on proper tech and safety with turns  Ambulation/Gait Ambulation/Gait assistance: Supervision;Min guard Gait Distance (Feet): 115 Feet Assistive device: Rolling walker (2 wheeled)   Gait velocity: decr   General Gait Details: 25% VC's on proper sequencing and proper walker to self distance.   Stairs Stairs: Yes Stairs assistance: Min guard;Supervision Stair Management: One rail Right;Step to pattern;Sideways Number of Stairs: 2 General stair comments: 50%  VC's on proper tech and safety   Wheelchair Mobility    Modified Rankin (Stroke Patients Only)       Balance                                            Cognition Arousal/Alertness: Awake/alert Behavior During Therapy: WFL for tasks assessed/performed Overall Cognitive Status: Within Functional Limits for tasks assessed                                 General Comments: AxO x 3 motivated Child psychotherapist   5 reps all standing TE's following HEP handout    General Comments        Pertinent Vitals/Pain Pain Assessment: 0-10 Pain Score: 3  Pain Location: Rt hip Pain Descriptors / Indicators: Aching;Discomfort;Tender Pain Intervention(s): Monitored during session;Premedicated before session;Repositioned;Ice applied    Home Living                      Prior Function            PT Goals (current goals can now be found in the care plan section) Progress towards PT goals: Progressing toward goals    Frequency    7X/week      PT Plan Current plan remains appropriate    Co-evaluation              AM-PAC PT "6 Clicks" Mobility  Outcome Measure  Help needed turning from your back to your side while in a flat bed without using bedrails?: None Help needed moving from lying on your back to sitting on the side of a flat bed without using bedrails?: None Help needed moving to and from a bed to a chair (including a wheelchair)?: None Help needed standing up from a chair using your arms (e.g., wheelchair or bedside chair)?: None Help needed to walk in hospital room?: A Little Help needed climbing 3-5 steps with a railing? : A Little 6 Click Score: 22    End of Session Equipment Utilized During Treatment: Gait belt Activity Tolerance: Patient tolerated treatment well Patient left: in chair;with call bell/phone within reach;with chair alarm set Nurse Communication: Mobility status (pt will need another  PT session to complete HEP and address stairs) PT Visit Diagnosis: Muscle weakness (generalized) (M62.81);Difficulty in walking, not elsewhere classified (R26.2)     Time: 1030-1055 PT Time Calculation (min) (ACUTE ONLY): 25 min  Charges:  $Gait Training: 8-22 mins $Therapeutic Exercise: 8-22 mins                     Felecia Shelling  PTA Acute  Rehabilitation Services Pager      [phone] Office      [phone]

## 2020-05-07 NOTE — Progress Notes (Signed)
Subjective: 1 Day Post-Op Procedure(s) (LRB): RIGHT TOTAL HIP ARTHROPLASTY ANTERIOR APPROACH (Right) Patient reports pain as mild.    Objective: Vital signs in last 24 hours: Temp:  [97.8 F (36.6 C)-98.5 F (36.9 C)] 97.8 F (36.6 C) (08/24 0541) Pulse Rate:  [68-88] 87 (08/24 0541) Resp:  [13-20] 20 (08/24 0541) BP: (117-162)/(58-88) 143/79 (08/24 0541) SpO2:  [96 %-100 %] 100 % (08/24 0541)  Intake/Output from previous day: 08/23 0701 - 08/24 0700 In: 3873 [P.O.:780; I.V.:2621.4; IV Piggyback:471.6] Out: 2345 [Urine:2245; Blood:100] Intake/Output this shift: No intake/output data recorded.  Recent Labs    05/07/20 0306  HGB 12.3*   Recent Labs    05/07/20 0306  WBC 25.2*  RBC 4.19*  HCT 35.2*  PLT 356   Recent Labs    05/07/20 0306  NA 136  K 4.3  CL 100  CO2 26  BUN 16  CREATININE 1.18  GLUCOSE 231*  CALCIUM 8.6*   No results for input(s): LABPT, INR in the last 72 hours.  Neurologically intact Neurovascular intact Sensation intact distally Intact pulses distally Dorsiflexion/Plantar flexion intact Incision: dressing C/D/I No cellulitis present Compartment soft   Assessment/Plan: 1 Day Post-Op Procedure(s) (LRB): RIGHT TOTAL HIP ARTHROPLASTY ANTERIOR APPROACH (Right) Advance diet Up with therapy D/C IV fluids Discharge home with home health after second PT session as long as he continues to mobilize WBAT RLE ABLA- mild and stable' D/c foley     Cristie Hem 05/07/2020, 7:59 AM

## 2020-05-07 NOTE — Discharge Summary (Signed)
Patient ID: Carl Davis MRN: [id number] DOB/AGE: [birth year] 54 y.o.  Admit date: 05/06/2020 Discharge date: 05/07/2020  Admission Diagnoses:  Principal Problem:   Primary osteoarthritis of right hip Active Problems:   Status post total replacement of right hip   Discharge Diagnoses:  Same  Past Medical History:  Diagnosis Date  . Abdominal pain   . Allergic rhinitis   . Arthritis   . Chest pain   . Elevated LDL cholesterol level   . Gout   . HTN (hypertension)   . Hydrocele, bilateral   . Hyperlipidemia   . Joint pain   . Left varicocele   . Rectal bleeding     Surgeries: Procedure(s): RIGHT TOTAL HIP ARTHROPLASTY ANTERIOR APPROACH on 05/06/2020   Consultants:   Discharged Condition: Improved  Hospital Course: Carl Davis is an 54 y.o. male who was admitted 05/06/2020 for operative treatment ofPrimary osteoarthritis of right hip. Patient has severe unremitting pain that affects sleep, daily activities, and work/hobbies. After pre-op clearance the patient was taken to the operating room on 05/06/2020 and underwent  Procedure(s): RIGHT TOTAL HIP ARTHROPLASTY ANTERIOR APPROACH.    Patient was given perioperative antibiotics:  Anti-infectives (From admission, onward)   Start     Dose/Rate Route Frequency Ordered Stop   05/06/20 1400  ceFAZolin (ANCEF) IVPB 2g/100 mL premix        2 g 200 mL/hr over 30 Minutes Intravenous Every 6 hours 05/06/20 1058 05/07/20 0208   05/06/20 0920  vancomycin (VANCOCIN) powder  Status:  Discontinued          As needed 05/06/20 0849 05/06/20 1050   05/06/20 0600  ceFAZolin (ANCEF) IVPB 2g/100 mL premix        2 g 200 mL/hr over 30 Minutes Intravenous On call to O.R. 05/06/20 0559 05/06/20 6010       Patient was given sequential compression devices, early ambulation, and chemoprophylaxis to prevent DVT.  Patient benefited maximally from hospital stay and there were no complications.    Recent vital signs:  Patient Vitals for the past 24  hrs:  BP Temp Temp src Pulse Resp SpO2  05/07/20 0541 (!) 143/79 97.8 F (36.6 C) Oral 87 20 100 %  05/07/20 0154 (!) 155/84 98.5 F (36.9 C) Oral 84 18 100 %  05/06/20 2140 (!) 159/83 97.8 F (36.6 C) Oral 81 18 99 %  05/06/20 1808 (!) 162/88 98.3 F (36.8 C) Oral 81 17 98 %  05/06/20 1403 125/79 -- -- 86 16 96 %  05/06/20 1300 131/79 98.2 F (36.8 C) Oral 81 16 99 %  05/06/20 1202 137/76 98.4 F (36.9 C) Oral 68 18 100 %  05/06/20 1058 137/83 98 F (36.7 C) Oral 75 16 98 %  05/06/20 1045 123/68 97.8 F (36.6 C) -- 77 13 100 %  05/06/20 1030 (!) 117/59 -- -- 70 15 100 %  05/06/20 1015 (!) 123/58 -- -- 76 14 100 %  05/06/20 1000 135/77 -- -- 79 16 99 %  05/06/20 0954 133/66 97.9 F (36.6 C) -- 88 13 100 %     Recent laboratory studies:  Recent Labs    05/07/20 0306  WBC 25.2*  HGB 12.3*  HCT 35.2*  PLT 356  NA 136  K 4.3  CL 100  CO2 26  BUN 16  CREATININE 1.18  GLUCOSE 231*  CALCIUM 8.6*     Discharge Medications:   Allergies as of 05/07/2020      Reactions  Penicillins Other (See Comments)   Patient does not recall       Medication List    STOP taking these medications   diclofenac 75 MG EC tablet Commonly known as: VOLTAREN   ibuprofen 200 MG tablet Commonly known as: ADVIL   meloxicam 7.5 MG tablet Commonly known as: Mobic     TAKE these medications   allopurinol 100 MG tablet Commonly known as: ZYLOPRIM Take 100 mg by mouth 2 (two) times daily.   amLODipine 10 MG tablet Commonly known as: NORVASC Take 10 mg by mouth daily.   aspirin EC 81 MG tablet Take 1 tablet (81 mg total) by mouth 2 (two) times daily. What changed:   how much to take  when to take this   atorvastatin 80 MG tablet Commonly known as: LIPITOR Take 80 mg by mouth daily.   cetirizine 10 MG tablet Commonly known as: ZYRTEC Take 10 mg by mouth daily.   docusate sodium 100 MG capsule Commonly known as: Colace Take 1 capsule (100 mg total) by mouth daily as  needed.   methocarbamol 500 MG tablet Commonly known as: Robaxin Take 1 tablet (500 mg total) by mouth 4 (four) times daily.   multivitamin tablet Take 1 tablet by mouth daily.   ondansetron 4 MG tablet Commonly known as: Zofran Take 1 tablet (4 mg total) by mouth every 8 (eight) hours as needed for nausea or vomiting.   oxyCODONE 10 mg 12 hr tablet Commonly known as: OXYCONTIN Take 1 tablet (10 mg total) by mouth every 12 (twelve) hours.   oxyCODONE-acetaminophen 5-325 MG tablet Commonly known as: Percocet Take 1-2 tablets by mouth every 6 (six) hours as needed for severe pain.            Durable Medical Equipment  (From admission, onward)         Start     Ordered   05/06/20 1058  DME Walker rolling  Once       Question:  Patient needs a walker to treat with the following condition  Answer:  History of hip replacement   05/06/20 1058   05/06/20 1058  DME 3 n 1  Once        05/06/20 1058   05/06/20 1058  DME Bedside commode  Once       Question:  Patient needs a bedside commode to treat with the following condition  Answer:  History of hip replacement   05/06/20 1058          Diagnostic Studies: DG Chest 2 View  Result Date: 05/03/2020 CLINICAL DATA:  Preoperative evaluation. EXAM: CHEST - 2 VIEW COMPARISON:  July 01, 2015 FINDINGS: There is no evidence of acute infiltrate, pleural effusion or pneumothorax. The heart size and mediastinal contours are within normal limits. The visualized skeletal structures are unremarkable. IMPRESSION: No active cardiopulmonary disease. Electronically Signed   By: Aram Candela M.D.   On: 05/03/2020 20:10   DG Pelvis Portable  Result Date: 05/06/2020 CLINICAL DATA:  Status post right hip arthroplasty EXAM: PORTABLE PELVIS 1-2 VIEWS COMPARISON:  January 05, 2020 intraoperative images obtained earlier in the day FINDINGS: Frontal pelvis image obtained. There is a total hip replacement on the right with prosthetic components on  the right well-seated on frontal view. No fracture or dislocation. Mild narrowing of left hip joint noted. IMPRESSION: Status post total hip replacement on the right with prosthetic components well-seated on frontal view. No fracture or dislocation. Electronically Signed   By:  Bretta Bang III M.D.   On: 05/06/2020 10:18   DG C-Arm 1-60 Min-No Report  Result Date: 05/06/2020 Fluoroscopy was utilized by the requesting physician.  No radiographic interpretation.   DG HIP OPERATIVE UNILAT W OR W/O PELVIS RIGHT  Result Date: 05/06/2020 CLINICAL DATA:  Status post total hip replacement on the right EXAM: OPERATIVE RIGHT HIP (WITH PELVIS IF PERFORMED) 2 VIEWS TECHNIQUE: Fluoroscopic spot image(s) were submitted for interpretation post-operatively. COMPARISON:  October 09, 2019 right hip radiograph; right hip MRI post arthrogram March 26, 2020 FLUOROSCOPY TIME:  0 minutes 28 seconds; 6 acquired images FINDINGS: Initial images demonstrate moderate narrowing of each hip joint. No fracture or dislocation. Subsequent images show placement of total hip replacement prosthesis on the right with prosthetic components well-seated on frontal view. No fracture or dislocation. IMPRESSION: Initial narrowing of each hip joint. Total hip replacement on the right performed with prosthetic components appearing well-seated on frontal view. No fracture or dislocation. Electronically Signed   By: Bretta Bang III M.D.   On: 05/06/2020 09:30    Disposition: Discharge disposition: 01-Home or Self Care          Follow-up Information    Tarry Kos, MD. Schedule an appointment as soon as possible for a visit in 2 week(s).   Specialty: Orthopedic Surgery Contact information: [address] [place] [address] [phone]                Signed: Cristie Hem 05/07/2020, 8:00 AM

## 2020-05-07 NOTE — Telephone Encounter (Signed)
Pharmacy aware

## 2020-05-21 ENCOUNTER — Ambulatory Visit (INDEPENDENT_AMBULATORY_CARE_PROVIDER_SITE_OTHER): Payer: Managed Care, Other (non HMO) | Admitting: Physician Assistant

## 2020-05-21 ENCOUNTER — Encounter: Payer: Self-pay | Admitting: Orthopaedic Surgery

## 2020-05-21 DIAGNOSIS — Z96641 Presence of right artificial hip joint: Secondary | ICD-10-CM

## 2020-05-21 MED ORDER — HYDROCODONE-ACETAMINOPHEN 5-325 MG PO TABS: 1.0000 | ORAL_TABLET | Freq: Three times a day (TID) | ORAL | 0 refills | Status: AC | PRN

## 2020-05-21 NOTE — Progress Notes (Signed)
   Post-Op Visit Note   Patient: Carl Davis           Date of Birth: July 16, 1966           MRN: [id number] Visit Date: 05/21/2020 PCP: Tally Joe, MD   Assessment & Plan:  Chief Complaint:  Chief Complaint  Patient presents with  . Left Hip - Routine Post Op   Visit Diagnoses:  1. Status post total hip replacement, right     Plan: Patient is a 54 year old gentleman who comes in today 3 weeks out right total hip replacement 05/06/2020.  Has been doing well.  He has been getting home health physical therapy.  He is walking with a walker today but does note he goes unassisted at home.  He has been taking his Percocet scheduled instead of as needed and is not in any pain at today's visit.  No fevers or chills.  Examination of the right hip reveals a well-healing surgical incision without complication.  His calves are soft nontender.  He is neurovascularly intact distally.  Today, Steri-Strips were applied.  He notes that home health physical therapy states that he does not need any more therapy so he will continue with an exercise program on his own.  He will let us know if he feels he needs outpatient therapy over the next few weeks.  I have refilled his narcotic pain medication and have weaned him Norco as needed.  Also written a prescription for compression socks.  Dental prophylaxis reinforced.  Follow-up with [place] in 4 weeks time for repeat evaluation AP pelvis x-rays.  Follow-Up Instructions: Return in about 4 weeks (around 06/18/2020).   Orders:  No orders of the defined types were placed in this encounter.  No orders of the defined types were placed in this encounter.   Imaging: No new imaging  PMFS History: Patient Active Problem List   Diagnosis Date Noted  . Status post total replacement of right hip 05/06/2020  . Primary osteoarthritis of right hip 12/01/2019   Past Medical History:  Diagnosis Date  . Abdominal pain   . Allergic rhinitis   . Arthritis   . Chest pain    . Elevated LDL cholesterol level   . Gout   . HTN (hypertension)   . Hydrocele, bilateral   . Hyperlipidemia   . Joint pain   . Left varicocele   . Rectal bleeding     Family History  Problem Relation Age of Onset  . Heart attack Brother 63  . Diabetes Neg Hx   . Hypertension Neg Hx   . Kidney disease Neg Hx     Past Surgical History:  Procedure Laterality Date  . NOSE SURGERY    . TOTAL HIP ARTHROPLASTY Right 05/06/2020   Procedure: RIGHT TOTAL HIP ARTHROPLASTY ANTERIOR APPROACH;  Surgeon: Tarry Kos, MD;  Location: [facility];  Service: Orthopedics;  Laterality: Right;   Social History   Occupational History  . Not on file  Tobacco Use  . Smoking status: Never Smoker  . Smokeless tobacco: Never Used  Vaping Use  . Vaping Use: Never used  Substance and Sexual Activity  . Alcohol use: No  . Drug use: No  . Sexual activity: Not on file

## 2020-05-24 NOTE — Addendum Note (Signed)
Addendum  created 05/24/20 1435 by Shelton Silvas, MD   Clinical Note Signed, Intraprocedure Blocks edited

## 2020-05-28 ENCOUNTER — Telehealth: Payer: Self-pay | Admitting: Orthopaedic Surgery

## 2020-05-28 NOTE — Telephone Encounter (Signed)
[place] forms received. Sent to [employer] 

## 2020-05-30 ENCOUNTER — Telehealth: Payer: Self-pay | Admitting: Orthopaedic Surgery

## 2020-05-30 NOTE — Telephone Encounter (Signed)
Received call from patient stating [employer] has not received forms. I advised they were faxed 9/2. I advised I would re fax and I emailed copy to patient [email or web address]. faxed [phone] JSHFW#[id number]

## 2020-06-10 ENCOUNTER — Telehealth: Payer: Self-pay

## 2020-06-10 NOTE — Telephone Encounter (Signed)
Please advise 

## 2020-06-10 NOTE — Telephone Encounter (Signed)
Patient called stating his disability was denied patient stated he needs a letter from Dr.Xu sent to [employer] stating how his recovery went and estimated time to return back to work. Patient would like letter to be faxed to [employer].  ° °Fax:[phone] °Call back:[phone] ° °

## 2020-06-10 NOTE — Telephone Encounter (Signed)
Called Patient no answer. LMOM.   Need to know what kind of work does he do?

## 2020-06-10 NOTE — Telephone Encounter (Signed)
Patient called stating his disability was denied patient stated he needs a letter from Dr.Xu sent to [facility] stating how his recovery went and estimated time to return back to work. Patient would like letter to be faxed to [facility].   Fax:[phone] Call back:[phone]

## 2020-06-10 NOTE — Telephone Encounter (Signed)
What does he do for work?

## 2020-06-14 ENCOUNTER — Encounter: Payer: Self-pay | Admitting: Orthopedic Surgery

## 2020-06-18 ENCOUNTER — Encounter: Payer: Self-pay | Admitting: Orthopaedic Surgery

## 2020-06-18 ENCOUNTER — Ambulatory Visit: Payer: Self-pay

## 2020-06-18 ENCOUNTER — Ambulatory Visit (INDEPENDENT_AMBULATORY_CARE_PROVIDER_SITE_OTHER): Payer: Managed Care, Other (non HMO) | Admitting: Orthopaedic Surgery

## 2020-06-18 DIAGNOSIS — Z96641 Presence of right artificial hip joint: Secondary | ICD-10-CM | POA: Diagnosis not present

## 2020-06-18 NOTE — Progress Notes (Signed)
   Post-Op Visit Note   Patient: Margie Billet           Date of Birth: [birth year]           MRN: [id number] Visit Date: 06/18/2020 PCP: Tally Joe, MD   Assessment & Plan:  Chief Complaint:  Chief Complaint  Patient presents with  . Right Hip - Routine Post Op, Follow-up   Visit Diagnoses:  1. Status post total hip replacement, right     Plan: Patient is a pleasant 54 year old gentleman who presents to our clinic today 6 weeks out right total hip replacement 05/06/2020.  He has been doing well.  He still has some pain to the lateral thigh that radiates to the knee.  He is still lacking a little strength as well.  Examination of his right hip reveals near full hip flexion.  He still lacks strength with resistance.  Calves are soft nontender.  He is neurovascular intact distally.  At this point, he will continue with his home exercise program.  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  At this point, we will keep him out of work for another 6 weeks.  He will follow up with [place] at that point for repeat evaluation.  Call with concerns or questions in the meantime.  Follow-Up Instructions: Return in about 6 weeks (around 07/30/2020).   Orders:  Orders Placed This Encounter  Procedures  . XR Pelvis 1-2 Views   No orders of the defined types were placed in this encounter.   Imaging: XR Pelvis 1-2 Views  Result Date: 06/18/2020 Well-seated prosthesis without complication   PMFS History: Patient Active Problem List   Diagnosis Date Noted  . Status post total replacement of right hip 05/06/2020  . Primary osteoarthritis of right hip 12/01/2019   Past Medical History:  Diagnosis Date  . Abdominal pain   . Allergic rhinitis   . Arthritis   . Chest pain   . Elevated LDL cholesterol level   . Gout   . HTN (hypertension)   . Hydrocele, bilateral   . Hyperlipidemia   . Joint pain   .  Left varicocele   . Rectal bleeding     Family History  Problem Relation Age of Onset  . Heart attack Brother 26  . Diabetes Neg Hx   . Hypertension Neg Hx   . Kidney disease Neg Hx     Past Surgical History:  Procedure Laterality Date  . NOSE SURGERY    . TOTAL HIP ARTHROPLASTY Right 05/06/2020   Procedure: RIGHT TOTAL HIP ARTHROPLASTY ANTERIOR APPROACH;  Surgeon: Tarry Kos, MD;  Location: [facility];  Service: Orthopedics;  Laterality: Right;   Social History   Occupational History  . Not on file  Tobacco Use  . Smoking status: Never Smoker  . Smokeless tobacco: Never Used  Vaping Use  . Vaping Use: Never used  Substance and Sexual Activity  . Alcohol use: No  . Drug use: No  . Sexual activity: Not on file

## 2020-06-25 ENCOUNTER — Telehealth: Payer: Self-pay | Admitting: Orthopaedic Surgery

## 2020-06-25 NOTE — Telephone Encounter (Signed)
IC, advised pt form was faxed to [facility] earlier this afternoon.

## 2020-06-25 NOTE — Telephone Encounter (Signed)
Pt called stating he spoke with [employer] and they informed him his paperwork was waiting for a signature from Dr.Xu and he would like to have his paperwork signed and faxed back  so they can send it to his job. Pt states he hasn't been able to receive a check because of this so he would like a CB when that's been done  [phone]

## 2020-06-25 NOTE — Telephone Encounter (Signed)
See message.

## 2020-07-30 ENCOUNTER — Ambulatory Visit: Payer: Managed Care, Other (non HMO) | Admitting: Orthopaedic Surgery

## 2020-08-02 ENCOUNTER — Ambulatory Visit (INDEPENDENT_AMBULATORY_CARE_PROVIDER_SITE_OTHER): Payer: Managed Care, Other (non HMO)

## 2020-08-02 ENCOUNTER — Ambulatory Visit (INDEPENDENT_AMBULATORY_CARE_PROVIDER_SITE_OTHER): Payer: Managed Care, Other (non HMO) | Admitting: Orthopaedic Surgery

## 2020-08-02 ENCOUNTER — Encounter: Payer: Self-pay | Admitting: Orthopaedic Surgery

## 2020-08-02 DIAGNOSIS — M545 Low back pain, unspecified: Secondary | ICD-10-CM | POA: Diagnosis not present

## 2020-08-02 DIAGNOSIS — Z96651 Presence of right artificial knee joint: Secondary | ICD-10-CM

## 2020-08-02 DIAGNOSIS — G8929 Other chronic pain: Secondary | ICD-10-CM

## 2020-08-02 MED ORDER — DICLOFENAC SODIUM 75 MG PO TBEC: 75.0000 mg | DELAYED_RELEASE_TABLET | Freq: Two times a day (BID) | ORAL | 0 refills | Status: AC | PRN

## 2020-08-02 NOTE — Progress Notes (Signed)
Office Visit Note   Patient: Carl Davis           Date of Birth: October 26, 1965           MRN: [id number] Visit Date: 08/02/2020              Requested by: Tally Joe, MD [phone] [address] [place],  [place] [address] PCP: Tally Joe, MD   Assessment & Plan: Visit Diagnoses:  1. History of total knee replacement, right   2. Chronic bilateral low back pain, unspecified whether sciatica present     Plan: Impression is 3 months status post right total hip replacement and new onset back pain.  In regards to his hip, believe he needs to continue with his home exercise program.  He will follow up with [place] in 9 months when he is a year out from surgery with AP pelvis x-rays.  Dental prophylaxis reinforced.  In regards to his back, believe he is somewhat deconditioned and needs to work on core strengthening exercises.  We will provide him with a handout today.  Have also called in diclofenac pills to take as needed.  He will call us if he is not any better.  Follow-Up Instructions: Return in about 9 months (around 05/02/2021).   Orders:  Orders Placed This Encounter  Procedures  . XR Lumbar Spine 2-3 Views   No orders of the defined types were placed in this encounter.     Procedures: No procedures performed   Clinical Data: No additional findings.   Subjective: Chief Complaint  Patient presents with  . Right Hip - Pain, Follow-up    HPI patient is a pleasant 54 year old gentleman who comes in today 3 months out right total hip replacement.  He is doing well.  He has recently started to have some bilateral lower back pain and right knee pain as well as burning to both feet after exercise.  No bowel or bladder change.  No history of back pathology.  Review of Systems as detailed in HPI.  All others reviewed and are negative.   Objective: Vital Signs: There were no vitals taken for this visit.  Physical Exam well-developed well-nourished gentleman in no acute  distress.  Alert oriented x3.  Ortho Exam right hip exam shows a negative logroll.  He has full flexion without pain.  Mild tenderness to the greater trochanter.  Negative straight leg raise.  Does have slight paraspinous tenderness on the right.  No focal weakness.  He is neurovascular intact distally.  Specialty Comments:  No specialty comments available.  Imaging: XR Lumbar Spine 2-3 Views  Result Date: 08/02/2020 X-rays demonstrate degenerative changes L4-5 and L5-S1    PMFS History: Patient Active Problem List   Diagnosis Date Noted  . Status post total replacement of right hip 05/06/2020  . Primary osteoarthritis of right hip 12/01/2019   Past Medical History:  Diagnosis Date  . Abdominal pain   . Allergic rhinitis   . Arthritis   . Chest pain   . Elevated LDL cholesterol level   . Gout   . HTN (hypertension)   . Hydrocele, bilateral   . Hyperlipidemia   . Joint pain   . Left varicocele   . Rectal bleeding     Family History  Problem Relation Age of Onset  . Heart attack Brother 47  . Diabetes Neg Hx   . Hypertension Neg Hx   . Kidney disease Neg Hx     Past  Surgical History:  Procedure Laterality Date  . NOSE SURGERY    . TOTAL HIP ARTHROPLASTY Right 05/06/2020   Procedure: RIGHT TOTAL HIP ARTHROPLASTY ANTERIOR APPROACH;  Surgeon: Tarry Kos, MD;  Location: [facility];  Service: Orthopedics;  Laterality: Right;   Social History   Occupational History  . Not on file  Tobacco Use  . Smoking status: Never Smoker  . Smokeless tobacco: Never Used  Vaping Use  . Vaping Use: Never used  Substance and Sexual Activity  . Alcohol use: No  . Drug use: No  . Sexual activity: Not on file

## 2020-08-14 ENCOUNTER — Telehealth: Payer: Self-pay | Admitting: Orthopaedic Surgery

## 2020-08-14 NOTE — Telephone Encounter (Signed)
[facility] forms received. Sent to [employer]. 

## 2020-08-27 ENCOUNTER — Telehealth: Payer: Self-pay | Admitting: Orthopaedic Surgery

## 2020-08-27 MED ORDER — CLINDAMYCIN HCL 150 MG PO CAPS: ORAL_CAPSULE | ORAL | 0 refills | Status: DC

## 2020-08-27 NOTE — Telephone Encounter (Signed)
Pt called in regards to needing his antibiotic before 2 if possible. Please call him once medication is filled at [phone]

## 2020-08-27 NOTE — Telephone Encounter (Signed)
Ok to do for two more weeks.  If he needs longer, best to come back in for repeat evaluation

## 2020-08-27 NOTE — Telephone Encounter (Signed)
Note done.  IC patient to advise. He will pick up at front desk.

## 2020-08-27 NOTE — Addendum Note (Signed)
Addended byPrescott Parma on: 08/27/2020 01:35 PM   Modules accepted: Orders

## 2020-08-27 NOTE — Telephone Encounter (Signed)
I called and s/w patient. He was requesting pre med prior to dental procedure. He has PCN allergy-I submitted Clindamycin However, he is also asking for an extension on his out of work note. Stated he is still unable to put his socks on. Please advise.

## 2020-09-11 ENCOUNTER — Telehealth: Payer: Self-pay | Admitting: Orthopaedic Surgery

## 2020-09-11 ENCOUNTER — Ambulatory Visit: Payer: Managed Care, Other (non HMO) | Admitting: Orthopaedic Surgery

## 2020-09-11 NOTE — Telephone Encounter (Signed)
[place] forms received. Sent to [employer] 

## 2020-09-12 ENCOUNTER — Ambulatory Visit (INDEPENDENT_AMBULATORY_CARE_PROVIDER_SITE_OTHER): Payer: Self-pay | Admitting: Orthopaedic Surgery

## 2020-09-12 ENCOUNTER — Encounter: Payer: Self-pay | Admitting: Orthopaedic Surgery

## 2020-09-12 ENCOUNTER — Other Ambulatory Visit: Payer: Self-pay

## 2020-09-12 VITALS — Ht 65.0 in | Wt 205.0 lb

## 2020-09-12 DIAGNOSIS — Z96641 Presence of right artificial hip joint: Secondary | ICD-10-CM

## 2020-09-12 MED ORDER — CLINDAMYCIN HCL 300 MG PO CAPS: ORAL_CAPSULE | ORAL | 0 refills | Status: AC

## 2020-09-12 NOTE — Progress Notes (Signed)
   Post-Op Visit Note   Patient: Margie Billet           Date of Birth: 1966-04-26           MRN: [id number] Visit Date: 09/12/2020 PCP: Tally Joe, MD   Assessment & Plan:  Chief Complaint:  Chief Complaint  Patient presents with  . Right Hip - Follow-up    Right total hip arthroplasty 05/06/2020   Visit Diagnoses:  1. History of total hip replacement, right     Plan: Patient is a very pleasant 54 year old gentleman who comes in today 4 months out right anterior total hip replacement 05/06/2020.  He has been doing well.  His only complaint is that he is slow to progress with bending over to pick things up.  This is an issue as she works for [employer] and thinks he needs a few more weeks before he is able to return to work full duty.  He notes that there is not a light duty or desk work option.  Examination of his left hip reveals near full hip flexion.  Negative logroll.  He is neurovascular intact distally.  At this point, we will provide him with a new work note to be out for another 3 weeks until he is 5 months out from surgery which will be on 10/07/2019.  Dental prophylaxis reinforced.  He will follow up with [place] when he is a year out from surgery.  Call with concerns or questions in meantime.  Follow-Up Instructions: Return in about 9 months (around 06/13/2021).   Orders:  No orders of the defined types were placed in this encounter.  No orders of the defined types were placed in this encounter.   Imaging: No new imaging  PMFS History: Patient Active Problem List   Diagnosis Date Noted  . Status post total replacement of right hip 05/06/2020  . Primary osteoarthritis of right hip 12/01/2019   Past Medical History:  Diagnosis Date  . Abdominal pain   . Allergic rhinitis   . Arthritis   . Chest pain   . Elevated LDL cholesterol level   . Gout   . HTN (hypertension)   . Hydrocele, bilateral   . Hyperlipidemia   . Joint pain   . Left varicocele   . Rectal bleeding      Family History  Problem Relation Age of Onset  . Heart attack Brother 21  . Diabetes Neg Hx   . Hypertension Neg Hx   . Kidney disease Neg Hx     Past Surgical History:  Procedure Laterality Date  . NOSE SURGERY    . TOTAL HIP ARTHROPLASTY Right 05/06/2020   Procedure: RIGHT TOTAL HIP ARTHROPLASTY ANTERIOR APPROACH;  Surgeon: Tarry Kos, MD;  Location: [facility];  Service: Orthopedics;  Laterality: Right;   Social History   Occupational History  . Not on file  Tobacco Use  . Smoking status: Never Smoker  . Smokeless tobacco: Never Used  Vaping Use  . Vaping Use: Never used  Substance and Sexual Activity  . Alcohol use: No  . Drug use: No  . Sexual activity: Not on file

## 2020-10-12 IMAGING — MR MR HIP*R* W/CM
4 of 5 series · 18 of 40 positions shown · IV contrast (agent unspecified)
Comparison: None.

CLINICAL DATA: Low back and severe right hip pain for 9 months.
Right leg weakness.

EXAM:
MRI OF THE RIGHT HIP WITH CONTRAST (MR Arthrogram)
TECHNIQUE: Multiplanar, multisequence MR imaging of the hip was performed
immediately following contrast injection into the hip joint under
fluoroscopic guidance. No intravenous contrast was administered.

[Series 3: T1 · coronal · 4.0mm · 0.53mm/px · 8 of 24 slices shown]
[im 1/24]
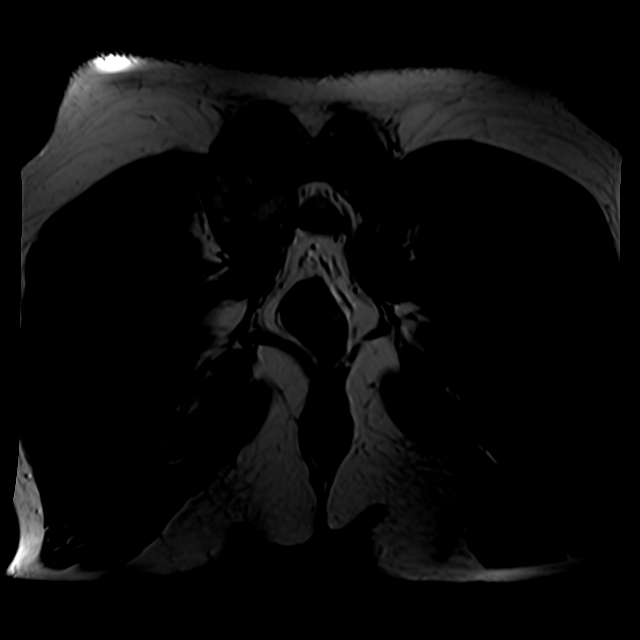
[im 4/24]
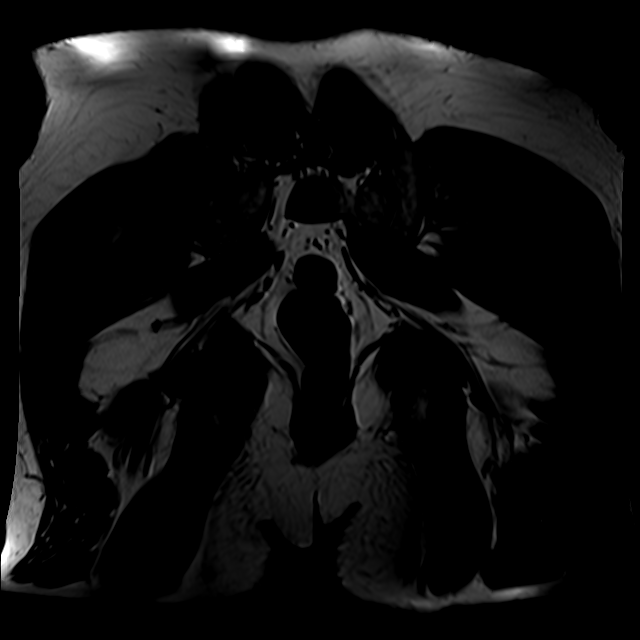
[im 7/24]
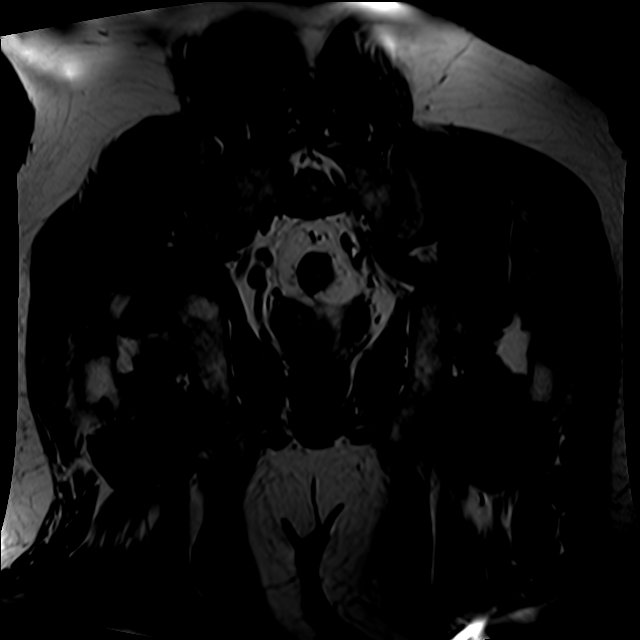
[im 10/24]
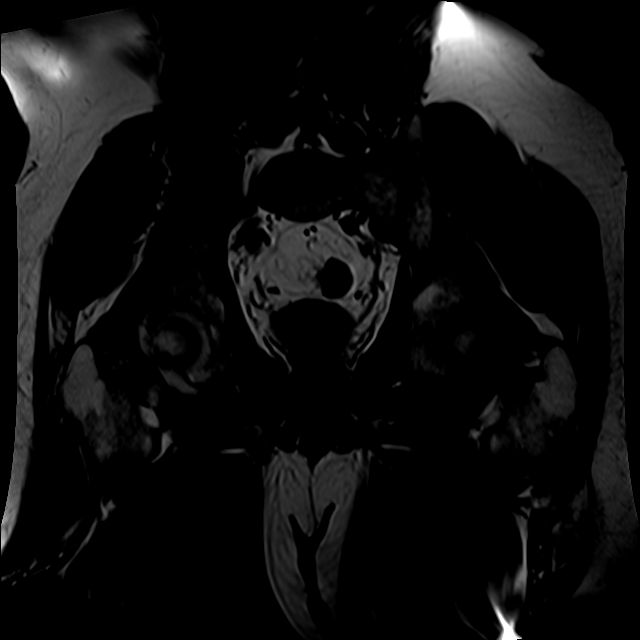
[im 14/24]
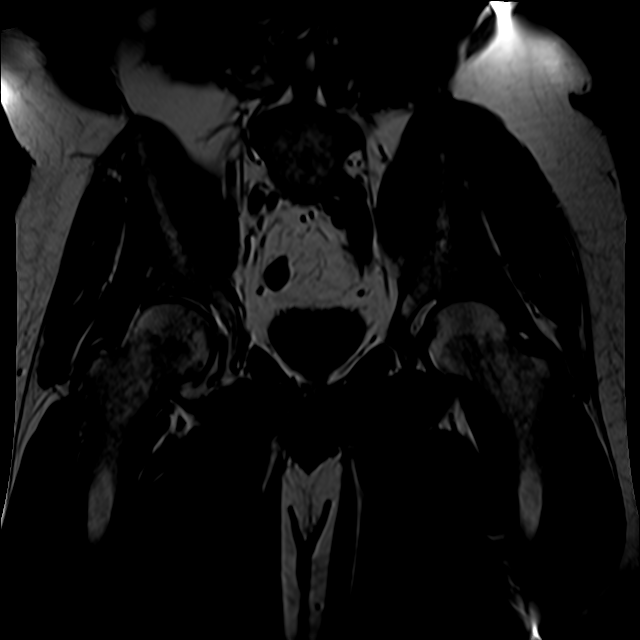
[im 17/24]
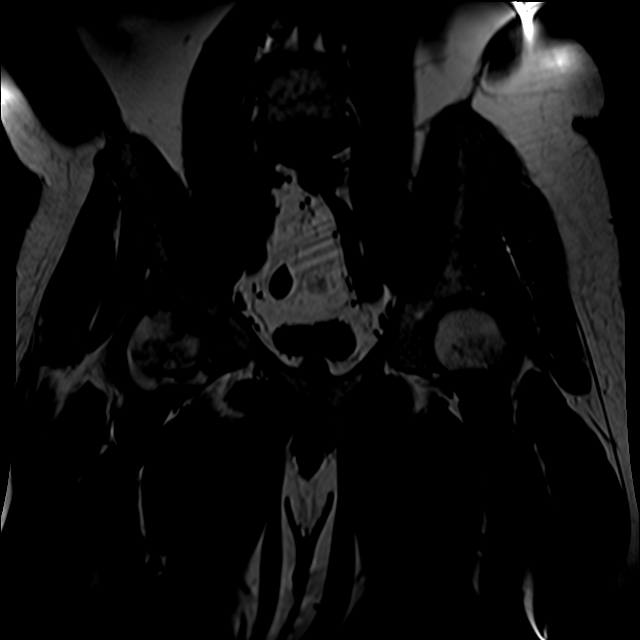
[im 20/24]
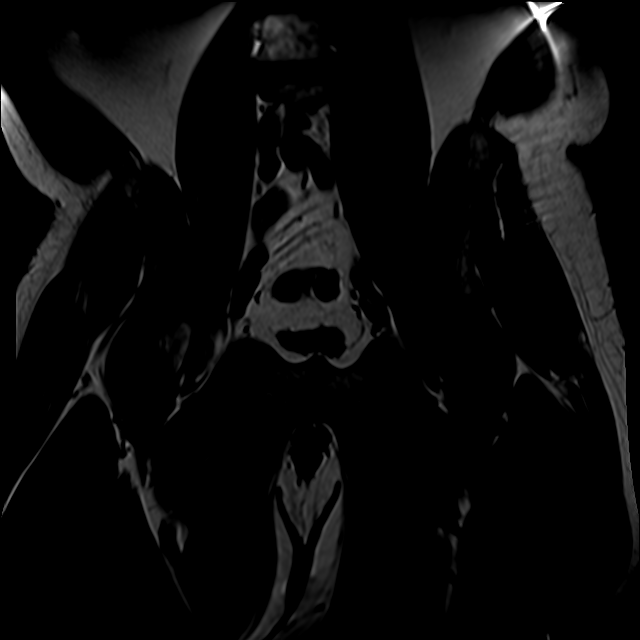
[im 24/24]
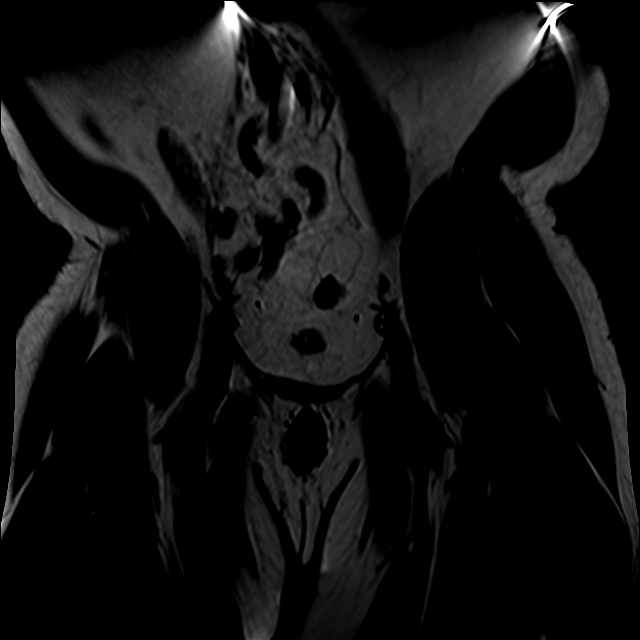

[Series 4: T2 fat-sat · coronal · 4.0mm · 0.53mm/px · 4 of 24 slices shown]
[im 1/24]
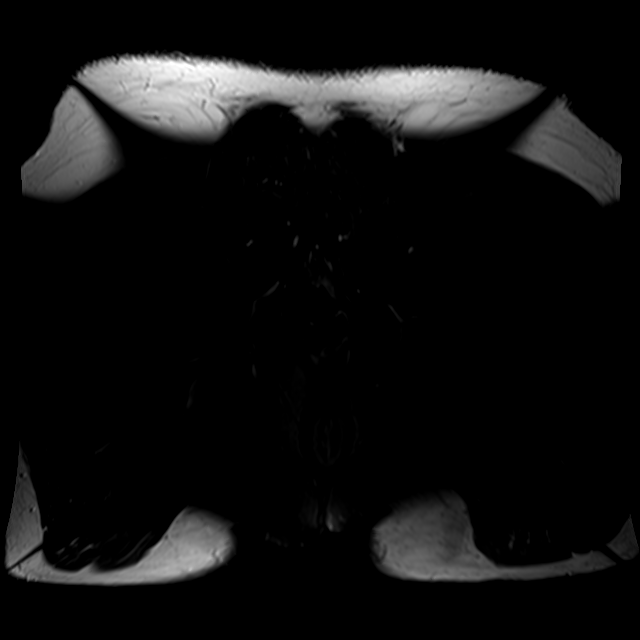
[im 4/24]
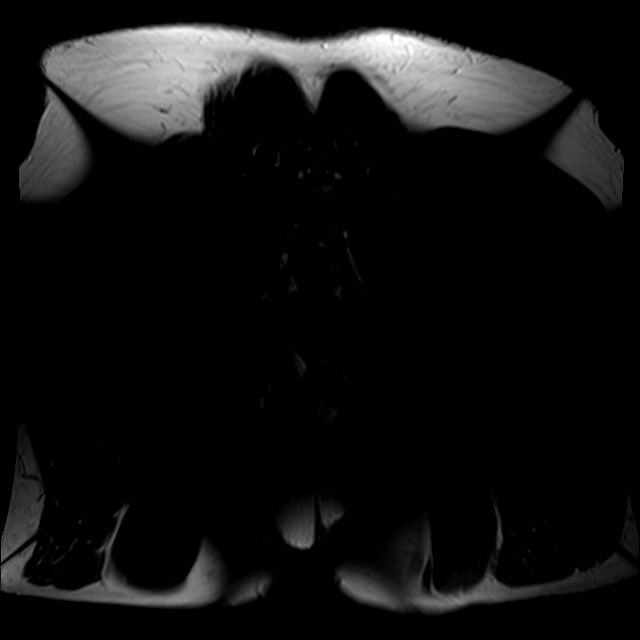
[im 14/24]
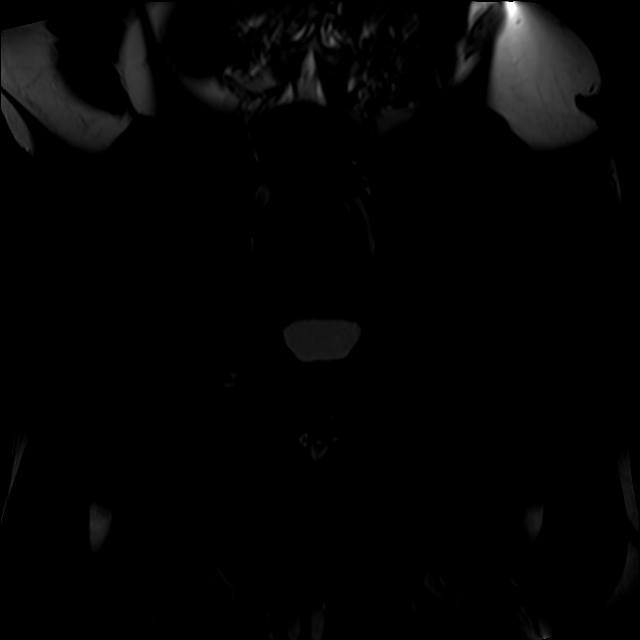
[im 20/24]
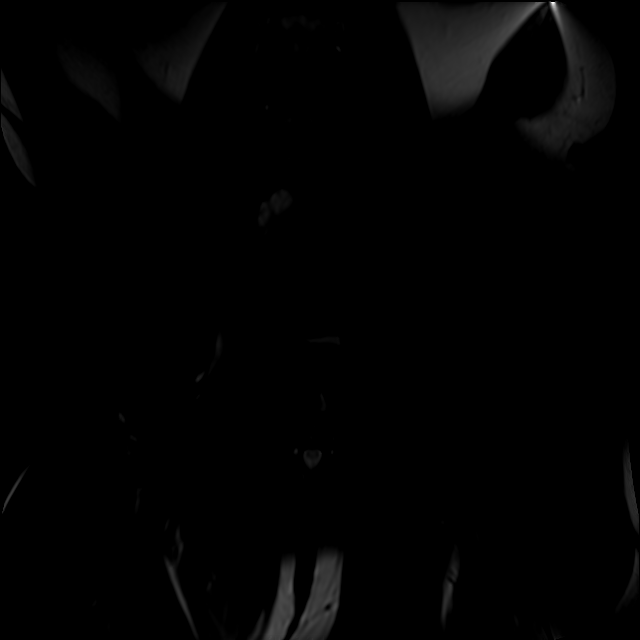

[Series 6: T1 fat-sat · axial · 4.0mm · 0.70mm/px · z∈[-48,+13]mm · 3 of 24 slices shown (1 of 2)]
[im 4/24]
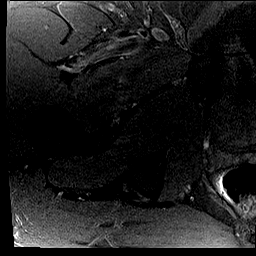
[im 14/24]
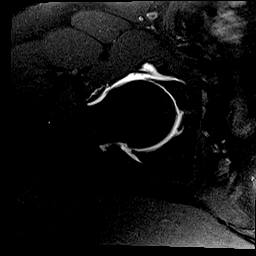
[im 20/24]
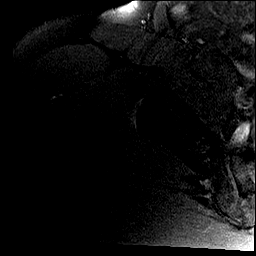

[Series 7: T1 fat-sat · sagittal · 4.0mm · 0.70mm/px · 3 of 27 slices shown (2 of 2)]
[im 4/27]
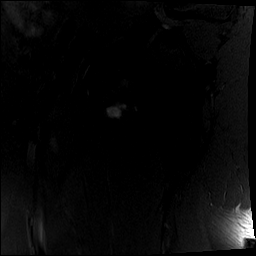
[im 14/27]
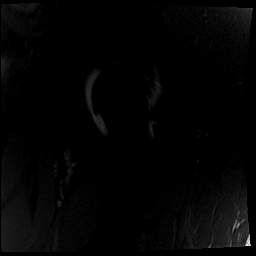
[im 23/27]
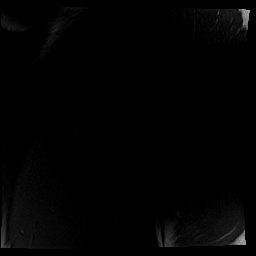

[18 of 40 positions shown; findings below may reference images not displayed]

FINDINGS: Bones: There is subchondral marrow edema about the right hip which
is worst in superior aspect of the femoral head and medial
acetabulum. Osteophytosis is present about the femoral head. No
subchondral cyst formation about the right hip. No fracture,
worrisome lesion or avascular necrosis of the femoral heads. Small
subchondral cyst in the anterior left acetabulum is noted.

Articular cartilage and labrum

Articular cartilage: Diffusely and markedly thinned. There is
bone-on-bone joint space narrowing anterosuperiorly.

Labrum:  The anterior and superior labrum are degenerated and torn.

Joint or bursal effusion

Joint effusion:  Distended with contrast.

Bursae: Negative.

Muscles and tendons

Muscles and tendons:  Intact and normal in appearance.

Other findings

Miscellaneous:   Imaged intrapelvic contents are unremarkable.
IMPRESSION: Advanced right hip osteoarthritis with associated degeneration and
tearing the right anterior and superior labrum.

## 2021-05-02 ENCOUNTER — Ambulatory Visit: Payer: Managed Care, Other (non HMO) | Admitting: Orthopaedic Surgery

## 2021-05-09 ENCOUNTER — Other Ambulatory Visit: Payer: Self-pay

## 2021-05-09 ENCOUNTER — Ambulatory Visit: Payer: Self-pay

## 2021-05-09 ENCOUNTER — Encounter: Payer: Self-pay | Admitting: Orthopaedic Surgery

## 2021-05-09 ENCOUNTER — Ambulatory Visit (INDEPENDENT_AMBULATORY_CARE_PROVIDER_SITE_OTHER): Payer: BC Managed Care – PPO | Admitting: Orthopaedic Surgery

## 2021-05-09 ENCOUNTER — Other Ambulatory Visit: Payer: Self-pay | Admitting: Physician Assistant

## 2021-05-09 ENCOUNTER — Other Ambulatory Visit: Payer: Self-pay | Admitting: Orthopaedic Surgery

## 2021-05-09 DIAGNOSIS — Z96641 Presence of right artificial hip joint: Secondary | ICD-10-CM | POA: Diagnosis not present

## 2021-05-09 MED ORDER — MELOXICAM 7.5 MG PO TABS: 7.5000 mg | ORAL_TABLET | Freq: Two times a day (BID) | ORAL | 2 refills | Status: AC | PRN

## 2021-05-09 NOTE — Telephone Encounter (Signed)
Looks like this was sent in already?

## 2021-05-10 NOTE — Progress Notes (Signed)
Post-Op Visit Note   Patient: Carl Davis           Date of Birth: 1966-01-30           MRN: [id number] Visit Date: 05/09/2021 PCP: Tally Joe, MD   Assessment & Plan:  Chief Complaint:  Chief Complaint  Patient presents with   Right Hip - Follow-up   Visit Diagnoses:  1. History of total hip replacement, right     Plan: Carl Davis is 1 year status post right total hip replacement.  Overall doing well has no real complaints.  He did have an episode recently in which he had intense right hip and thigh pain but this resolved with rest and anti-inflammatories.  Otherwise he has not had any problems with the hip replacement.  Right hip scar is fully healed.  Excellent range of motion without pain.  Normal ambulation and gait.  X-rays show stable total hip replacement without complications.  Carl Davis has done very well from his hip replacement.  Dental prophylaxis reinforced.  It sounds like his job is fairly physical and requires a lot of lifting so he will be aware not to overdo it so that he does not experience the same symptoms again as before.  We will see him back for another visit at another year.  He will need x-rays at that time.  Follow-Up Instructions: Return in about 1 year (around 05/09/2022).   Orders:  Orders Placed This Encounter  Procedures   XR HIP UNILAT W OR W/O PELVIS 2-3 VIEWS RIGHT   Meds ordered this encounter  Medications   meloxicam (MOBIC) 7.5 MG tablet    Sig: Take 1 tablet (7.5 mg total) by mouth 2 (two) times daily as needed for pain.    Dispense:  30 tablet    Refill:  2   meloxicam (MOBIC) 7.5 MG tablet    Sig: Take 1 tablet (7.5 mg total) by mouth 2 (two) times daily as needed for pain.    Dispense:  30 tablet    Refill:  2    Imaging: No results found.  PMFS History: Patient Active Problem List   Diagnosis Date Noted   Status post total replacement of right hip 05/06/2020   Primary osteoarthritis of right hip 12/01/2019   Past  Medical History:  Diagnosis Date   Abdominal pain    Allergic rhinitis    Arthritis    Chest pain    Elevated LDL cholesterol level    Gout    HTN (hypertension)    Hydrocele, bilateral    Hyperlipidemia    Joint pain    Left varicocele    Rectal bleeding     Family History  Problem Relation Age of Onset   Heart attack Brother 73   Diabetes Neg Hx    Hypertension Neg Hx    Kidney disease Neg Hx     Past Surgical History:  Procedure Laterality Date   NOSE SURGERY     TOTAL HIP ARTHROPLASTY Right 05/06/2020   Procedure: RIGHT TOTAL HIP ARTHROPLASTY ANTERIOR APPROACH;  Surgeon: Tarry Kos, MD;  Location: [facility];  Service: Orthopedics;  Laterality: Right;   Social History   Occupational History   Not on file  Tobacco Use   Smoking status: Never   Smokeless tobacco: Never  Vaping Use   Vaping Use: Never used  Substance and Sexual Activity   Alcohol use: No   Drug use: No   Sexual activity: Not on  file

## 2021-11-19 ENCOUNTER — Other Ambulatory Visit: Payer: Self-pay | Admitting: Orthopaedic Surgery

## 2022-05-08 ENCOUNTER — Encounter: Payer: Self-pay | Admitting: Orthopaedic Surgery

## 2022-05-08 ENCOUNTER — Ambulatory Visit (INDEPENDENT_AMBULATORY_CARE_PROVIDER_SITE_OTHER): Payer: 59 | Admitting: Orthopaedic Surgery

## 2022-05-08 ENCOUNTER — Ambulatory Visit (INDEPENDENT_AMBULATORY_CARE_PROVIDER_SITE_OTHER): Payer: 59

## 2022-05-08 DIAGNOSIS — Z96641 Presence of right artificial hip joint: Secondary | ICD-10-CM

## 2022-05-08 NOTE — Progress Notes (Signed)
   Post-Op Visit Note   Patient: Margie Billet           Date of Birth: November 07, 1965           MRN: [id number] Visit Date: 05/08/2022 PCP: Tally Joe, MD   Assessment & Plan:  Chief Complaint:  Chief Complaint  Patient presents with   Right Hip - Follow-up    Right total hip arthroplasty 05/06/2020   Visit Diagnoses:  1. History of total hip replacement, right     Plan: Mr. Cull is 2-year status post right total hip on 05/06/2020.  He is doing well.  Has been back to work at [employer] for a while now.  Has no complaints.  Examination of the right hip shows a fully healed surgical scar.  Normal gait and hip range of motion and circumduction without pain.  X-rays show stable right total hip replacement with pedestal effect of the femoral stem demonstrating well fixed device.  From my standpoint Mr. Meek is doing very well and he can follow-up with [place] every 2 to 3 years or just as needed if he chooses to do that.  Follow-Up Instructions: No follow-ups on file.   Orders:  Orders Placed This Encounter  Procedures   XR Pelvis 1-2 Views   No orders of the defined types were placed in this encounter.   Imaging: No results found.  PMFS History: Patient Active Problem List   Diagnosis Date Noted   Status post total replacement of right hip 05/06/2020   Primary osteoarthritis of right hip 12/01/2019   Past Medical History:  Diagnosis Date   Abdominal pain    Allergic rhinitis    Arthritis    Chest pain    Elevated LDL cholesterol level    Gout    HTN (hypertension)    Hydrocele, bilateral    Hyperlipidemia    Joint pain    Left varicocele    Rectal bleeding     Family History  Problem Relation Age of Onset   Heart attack Brother 47   Diabetes Neg Hx    Hypertension Neg Hx    Kidney disease Neg Hx     Past Surgical History:  Procedure Laterality Date   NOSE SURGERY     TOTAL HIP ARTHROPLASTY Right 05/06/2020   Procedure: RIGHT TOTAL HIP ARTHROPLASTY ANTERIOR  APPROACH;  Surgeon: Tarry Kos, MD;  Location: [facility];  Service: Orthopedics;  Laterality: Right;   Social History   Occupational History   Not on file  Tobacco Use   Smoking status: Never   Smokeless tobacco: Never  Vaping Use   Vaping Use: Never used  Substance and Sexual Activity   Alcohol use: No   Drug use: No   Sexual activity: Not on file

## 2022-05-27 ENCOUNTER — Ambulatory Visit
Admission: RE | Admit: 2022-05-27 | Discharge: 2022-05-27 | Disposition: A | Discharge status: Home / Self Care | Payer: 59 | Source: Ambulatory Visit | Attending: Family Medicine | Admitting: Family Medicine

## 2022-05-27 ENCOUNTER — Other Ambulatory Visit: Payer: Self-pay | Admitting: Family Medicine

## 2022-05-27 DIAGNOSIS — R519 Headache, unspecified: Secondary | ICD-10-CM

## 2022-12-02 ENCOUNTER — Ambulatory Visit (INDEPENDENT_AMBULATORY_CARE_PROVIDER_SITE_OTHER): Payer: 59 | Admitting: Physician Assistant

## 2022-12-02 ENCOUNTER — Other Ambulatory Visit (INDEPENDENT_AMBULATORY_CARE_PROVIDER_SITE_OTHER): Payer: 59

## 2022-12-02 ENCOUNTER — Encounter: Payer: Self-pay | Admitting: Physician Assistant

## 2022-12-02 DIAGNOSIS — M5440 Lumbago with sciatica, unspecified side: Secondary | ICD-10-CM | POA: Diagnosis not present

## 2022-12-02 DIAGNOSIS — M25552 Pain in left hip: Secondary | ICD-10-CM

## 2022-12-02 DIAGNOSIS — M545 Low back pain, unspecified: Secondary | ICD-10-CM | POA: Insufficient documentation

## 2022-12-02 MED ORDER — METHYLPREDNISOLONE 4 MG PO TBPK: ORAL_TABLET | ORAL | 0 refills | Status: AC

## 2022-12-02 NOTE — Progress Notes (Signed)
Office Visit Note   Patient: Carl Davis           Date of Birth: 1966-04-25           MRN: [id number] Visit Date: 12/02/2022              Requested by: Antony Contras, MD [address] [place],  [place] [id number] PCP: Antony Contras, MD  Chief Complaint  Patient presents with   Left Hip - Pain      HPI: Carl Davis is a pleasant 57 year old gentleman who is followed by Dr. Erlinda Hong.  He presents today with a 3-day history of low back pain radiating down to the posterior left hip.  Denies any groin pain.  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  Denies any loss of bowel or bladder control.  Had difficulty sleeping last night did notice he had some numbness in his left foot when he got up this morning.  Denies any weakness.  He is been diagnosed with diabetes the most currently he is only on metformin and his most recent hemoglobin A1c done 2 weeks ago was 5.9  Assessment & Plan: Visit Diagnoses:  1. Pain in left hip   2. Acute bilateral low back pain with sciatica, sciatica laterality unspecified     Plan: Low back pain he does have some arthritic changes in his low back.  Do not find any compelling evidence this is secondary to a hip problem.  There is no sign of an infective process.  We have given him some back exercises.  He has been taking ibuprofen and some of his Percocet that he had leftover but it is not helping.  I did asked that he stop the anti-inflammatories.  We could place him on a short taper dose of Medrol.  He is willing to do this.  I did tell him he needs to monitor his blood sugars and discontinue it if he has a significant spike  Follow-Up Instructions: No follow-ups on file.   Ortho Exam  Patient is alert, oriented, no adenopathy, well-dressed, normal affect, normal respiratory effort. Examination of his low back he has no redness no erythema no deformity.  He does have some tenderness to  palpation in the lower lumbar spine with radiating out to the left buttock.  He has good strength with dorsiflexion plantarflexion of his ankles extension flexion of his legs.  Mild straight leg raise.  No pain with internal/external rotation of his hip he is neurovascular intact  Imaging: No results found. No images are attached to the encounter.  Labs: No results found for: "HGBA1C", "ESRSEDRATE", "CRP", "LABURIC", "REPTSTATUS", "GRAMSTAIN", "CULT", "LABORGA"   Lab Results  Component Value Date   ALBUMIN 4.4 05/03/2020   ALBUMIN 4.1 02/21/2009   PREALBUMIN 30.3 05/03/2020    No results found for: "MG" No results found for: "VD25OH"  Lab Results  Component Value Date   PREALBUMIN 30.3 05/03/2020      Latest Ref Rng & Units 05/07/2020    3:06 AM 05/03/2020    1:29 PM 07/01/2015    3:47 PM  CBC EXTENDED  WBC 4.0 - 10.5 K/uL 25.2  12.0  11.2   RBC 4.22 - 5.81 MIL/uL 4.19  5.38  5.29   Hemoglobin 13.0 - 17.0 g/dL 12.3  15.6  15.2   HCT 39.0 - 52.0 % 35.2  44.6  43.1   Platelets  150 - 400 K/uL 356  380  352   NEUT# 1.7 - 7.7 K/uL  7.9    Lymph# 0.7 - 4.0 K/uL  2.6       There is no height or weight on file to calculate BMI.  Orders:  Orders Placed This Encounter  Procedures   XR Pelvis 1-2 Views   XR Lumbar Spine 2-3 Views   Meds ordered this encounter  Medications   methylPREDNISolone (MEDROL DOSEPAK) 4 MG TBPK tablet    Sig: Take as directed with food.    Dispense:  21 tablet    Refill:  0     Procedures: No procedures performed  Clinical Data: No additional findings.  ROS:  All other systems negative, except as noted in the HPI. Review of Systems  Objective: Vital Signs: There were no vitals taken for this visit.  Specialty Comments:  No specialty comments available.  PMFS History: Patient Active Problem List   Diagnosis Date Noted   Low back pain 12/02/2022   Status post total replacement of right hip 05/06/2020   Primary osteoarthritis of  right hip 12/01/2019   Past Medical History:  Diagnosis Date   Abdominal pain    Allergic rhinitis    Arthritis    Chest pain    Elevated LDL cholesterol level    Gout    HTN (hypertension)    Hydrocele, bilateral    Hyperlipidemia    Joint pain    Left varicocele    Rectal bleeding     Family History  Problem Relation Age of Onset   Heart attack Brother 61   Diabetes Neg Hx    Hypertension Neg Hx    Kidney disease Neg Hx     Past Surgical History:  Procedure Laterality Date   NOSE SURGERY     TOTAL HIP ARTHROPLASTY Right 05/06/2020   Procedure: RIGHT TOTAL HIP ARTHROPLASTY ANTERIOR APPROACH;  Surgeon: Leandrew Koyanagi, MD;  Location: [facility];  Service: Orthopedics;  Laterality: Right;   Social History   Occupational History   Not on file  Tobacco Use   Smoking status: Never   Smokeless tobacco: Never  Vaping Use   Vaping Use: Never used  Substance and Sexual Activity   Alcohol use: No   Drug use: No   Sexual activity: Not on file

## 2022-12-29 ENCOUNTER — Ambulatory Visit (INDEPENDENT_AMBULATORY_CARE_PROVIDER_SITE_OTHER): Payer: 59 | Admitting: Physician Assistant

## 2022-12-29 ENCOUNTER — Encounter: Payer: Self-pay | Admitting: Physician Assistant

## 2022-12-29 DIAGNOSIS — M5440 Lumbago with sciatica, unspecified side: Secondary | ICD-10-CM

## 2022-12-29 NOTE — Progress Notes (Signed)
Office Visit Note   Patient: Carl Davis           Date of Birth: 03-29-1966           MRN: [id number] Visit Date: 12/29/2022              Requested by: Tally Joe, MD [phone] [place],  [place] [address] PCP: Tally Joe, MD  Chief Complaint  Patient presents with   Lower Back - Pain, Follow-up      HPI: Carl Davis is a pleasant 57 year old gentleman who now presents with a greater than 6-week history of low back pain with radicular findings radiating down his posterior left buttock into the lateral side of his leg almost to his ankle.  I first saw him a month ago for this and he was given a home exercise program which she has been compliant with doing.  He was also trialed on a Medrol Dosepak.  He said the Medrol Dosepak did help but only for about 3 days.  He does feel like his leg maybe feels a little bit weak but denies loss of bowel or bladder control classifies his pain as moderate  Assessment & Plan: Visit Diagnoses:  1. Acute bilateral low back pain with sciatica, sciatica laterality unspecified     Plan: Patient with a greater then 6-week history of low back pain and radicular findings going down his left leg.  He tried a course of the Medrol Dosepak which did give him long-term relief.  He cannot take anti-inflammatories because of a kidney condition.  He was compliant with doing a home exercise program.  I do have concerns as his leg is getting a little weaker.  He has a positive straight leg raise which reproduces pain in his back.  He is now beginning to have some problems on the right side.  I recommend an MRI and if appropriate referral to Dr. Alvester Morin for a steroid injection  Follow-Up Instructions: No follow-ups on file.   Ortho Exam  Patient is alert, oriented, no adenopathy, well-dressed, normal affect, normal respiratory effort. Examination he is difficulty standing when he is been sitting for a while prefers to have his leg straight out  secondary to the pain in his back.  He has fair strength with dorsiflexion plantarflexion sensation is intact he does have a positive straight leg raise.  Compartments are soft and nontender no Homans' sign  Imaging: No results found. No images are attached to the encounter.  Labs: No results found for: "HGBA1C", "ESRSEDRATE", "CRP", "LABURIC", "REPTSTATUS", "GRAMSTAIN", "CULT", "LABORGA"   Lab Results  Component Value Date   ALBUMIN 4.4 05/03/2020   ALBUMIN 4.1 02/21/2009   PREALBUMIN 30.3 05/03/2020    No results found for: "MG" No results found for: "VD25OH"  Lab Results  Component Value Date   PREALBUMIN 30.3 05/03/2020      Latest Ref Rng & Units 05/07/2020    3:06 AM 05/03/2020    1:29 PM 07/01/2015    3:47 PM  CBC EXTENDED  WBC 4.0 - 10.5 K/uL 25.2  12.0  11.2   RBC 4.22 - 5.81 MIL/uL 4.19  5.38  5.29   Hemoglobin 13.0 - 17.0 g/dL 62.9  52.8  41.3   HCT 39.0 - 52.0 % 35.2  44.6  43.1   Platelets 150 - 400 K/uL 356  380  352   NEUT# 1.7 - 7.7 K/uL  7.9    Lymph# 0.7 - 4.0 K/uL  2.6       There is no height or weight on file to calculate BMI.  Orders:  Orders Placed This Encounter  Procedures   MR Lumbar Spine w/o contrast   No orders of the defined types were placed in this encounter.    Procedures: No procedures performed  Clinical Data: No additional findings.  ROS:  All other systems negative, except as noted in the HPI. Review of Systems  Objective: Vital Signs: There were no vitals taken for this visit.  Specialty Comments:  No specialty comments available.  PMFS History: Patient Active Problem List   Diagnosis Date Noted   Low back pain 12/02/2022   Status post total replacement of right hip 05/06/2020   Primary osteoarthritis of right hip 12/01/2019   Past Medical History:  Diagnosis Date   Abdominal pain    Allergic rhinitis    Arthritis    Chest pain    Elevated LDL cholesterol level    Gout    HTN (hypertension)     Hydrocele, bilateral    Hyperlipidemia    Joint pain    Left varicocele    Rectal bleeding     Family History  Problem Relation Age of Onset   Heart attack Brother 4   Diabetes Neg Hx    Hypertension Neg Hx    Kidney disease Neg Hx     Past Surgical History:  Procedure Laterality Date   NOSE SURGERY     TOTAL HIP ARTHROPLASTY Right 05/06/2020   Procedure: RIGHT TOTAL HIP ARTHROPLASTY ANTERIOR APPROACH;  Surgeon: Tarry Kos, MD;  Location: [facility];  Service: Orthopedics;  Laterality: Right;   Social History   Occupational History   Not on file  Tobacco Use   Smoking status: Never   Smokeless tobacco: Never  Vaping Use   Vaping Use: Never used  Substance and Sexual Activity   Alcohol use: No   Drug use: No   Sexual activity: Not on file

## 2023-01-06 ENCOUNTER — Ambulatory Visit
Admission: RE | Admit: 2023-01-06 | Discharge: 2023-01-06 | Disposition: A | Discharge status: Home / Self Care | Payer: 59 | Source: Ambulatory Visit | Attending: Physician Assistant | Admitting: Physician Assistant

## 2023-01-06 DIAGNOSIS — M5440 Lumbago with sciatica, unspecified side: Secondary | ICD-10-CM

## 2023-01-14 ENCOUNTER — Other Ambulatory Visit: Payer: Self-pay | Admitting: Physician Assistant

## 2023-01-14 DIAGNOSIS — M5440 Lumbago with sciatica, unspecified side: Secondary | ICD-10-CM

## 2023-01-28 ENCOUNTER — Other Ambulatory Visit: Payer: Self-pay

## 2023-01-28 ENCOUNTER — Ambulatory Visit (INDEPENDENT_AMBULATORY_CARE_PROVIDER_SITE_OTHER): Payer: 59 | Admitting: Physical Medicine and Rehabilitation

## 2023-01-28 VITALS — BP 119/77 | HR 94

## 2023-01-28 DIAGNOSIS — M5416 Radiculopathy, lumbar region: Secondary | ICD-10-CM | POA: Diagnosis not present

## 2023-01-28 MED ORDER — METHYLPREDNISOLONE ACETATE 80 MG/ML IJ SUSP
80.0000 mg | Freq: Once | INTRAMUSCULAR | Status: AC
  Administered 2023-01-28: 80 mg

## 2023-01-28 NOTE — Progress Notes (Signed)
Functional Pain Scale - descriptive words and definitions  Moderate (4)   Constantly aware of pain, can complete ADLs with modification/sleep marginally affected at times/passive distraction is of no use, but active distraction gives some relief. Moderate range order  Average Pain  varies   +Driver, -BT, -Dye Allergies.  Lower back pain on left side that radiates into left leg

## 2023-01-28 NOTE — Patient Instructions (Signed)

## 2023-02-10 NOTE — Progress Notes (Signed)
Carl Davis - 57 y.o. male MRN [id number]  Date of birth: 1966-04-21  Office Visit Note: Visit Date: 01/28/2023 PCP: Tally Joe, MD Referred by: Tally Joe, MD  Subjective: Chief Complaint  Patient presents with   Lower Back - Pain   HPI:  Carl Davis is a 57 y.o. male who comes in today at the request of [place] Persons, PA-C for planned Left L4-5 Lumbar Interlaminar epidural steroid injection with fluoroscopic guidance.  The patient has failed conservative care including home exercise, medications, time and activity modification.  This injection will be diagnostic and hopefully therapeutic.  Please see requesting physician notes for further details and justification.   ROS Otherwise per HPI.  Assessment & Plan: Visit Diagnoses:    ICD-10-CM   1. Lumbar radiculopathy  M54.16 XR C-ARM NO REPORT    Epidural Steroid injection    methylPREDNISolone acetate (DEPO-MEDROL) injection 80 mg      Plan: No additional findings.   Meds & Orders:  Meds ordered this encounter  Medications   methylPREDNISolone acetate (DEPO-MEDROL) injection 80 mg    Orders Placed This Encounter  Procedures   XR C-ARM NO REPORT   Epidural Steroid injection    Follow-up: Return for visit to requesting provider as needed.   Procedures: No procedures performed  Lumbar Epidural Steroid Injection - Interlaminar Approach with Fluoroscopic Guidance  Patient: Carl Davis      Date of Birth: Jan 20, 1966 MRN: [id number] PCP: Tally Joe, MD      Visit Date: 01/28/2023   Universal Protocol:     Consent Given By: the patient  Position: PRONE  Additional Comments: Vital signs were monitored before and after the procedure. Patient was prepped and draped in the usual sterile fashion. The correct patient, procedure, and site was verified.   Injection Procedure Details:   Procedure diagnoses: Lumbar radiculopathy [M54.16]   Meds Administered:  Meds ordered this encounter  Medications    methylPREDNISolone acetate (DEPO-MEDROL) injection 80 mg     Laterality: Left  Location/Site:  L4-5  Needle: 3.5 in., 20 ga. Tuohy  Needle Placement: Paramedian epidural  Findings:   -Comments: Excellent flow of contrast into the epidural space.  Procedure Details: Using a paramedian approach from the side mentioned above, the region overlying the inferior lamina was localized under fluoroscopic visualization and the soft tissues overlying this structure were infiltrated with 4 ml. of 1% Lidocaine without Epinephrine. The Tuohy needle was inserted into the epidural space using a paramedian approach.   The epidural space was localized using loss of resistance along with counter oblique bi-planar fluoroscopic views.  After negative aspirate for air, blood, and CSF, a 2 ml. volume of Isovue-250 was injected into the epidural space and the flow of contrast was observed. Radiographs were obtained for documentation purposes.    The injectate was administered into the level noted above.   Additional Comments:  No complications occurred Dressing: 2 x 2 sterile gauze and Band-Aid    Post-procedure details: Patient was observed during the procedure. Post-procedure instructions were reviewed.  Patient left the clinic in stable condition.   Clinical History: CLINICAL DATA:  Lumbar radiculopathy with symptoms over 6 weeks. Low back pain with left leg pain, weakness, and numbness.   EXAM: MRI LUMBAR SPINE WITHOUT CONTRAST   TECHNIQUE: Multiplanar, multisequence MR imaging of the lumbar spine was performed. No intravenous contrast was administered.   COMPARISON:  None Available.   FINDINGS: Segmentation:  Standard.   Alignment:  Mild  dextroscoliosis   Vertebrae: No fracture, evidence of discitis, or aggressive bone lesion. Rounded hypointensity at the posterior left ilium is stable since 2021.   Conus medullaris and cauda equina: Conus extends to the L1 level. Conus and cauda  equina appear normal.   Paraspinal and other soft tissues: Negative for perispinal mass or inflammation   Disc levels:   T12- L1: Unremarkable.   L1-L2: Unremarkable.   L2-L3: Disc narrowing and bulging with small right foraminal protrusion   L3-L4: Disc narrowing and bulging.  Negative facets   L4-L5: Disc narrowing and bulging.  Negative facets.   L5-S1:Minor facet spurring.   IMPRESSION: Lumbar spine degeneration with mild scoliosis. No neural impingement or inflammation to explain left leg symptoms.     Electronically Signed   By: Tiburcio Pea M.D.   On: 01/11/2023 08:22     Objective:  VS:  HT:    WT:   BMI:     BP:119/77  HR:94bpm  TEMP: ( )  RESP:  Physical Exam Vitals and nursing note reviewed.  Constitutional:      General: He is not in acute distress.    Appearance: Normal appearance. He is not ill-appearing.  HENT:     Head: Normocephalic and atraumatic.     Right Ear: External ear normal.     Left Ear: External ear normal.     Nose: No congestion.  Eyes:     Extraocular Movements: Extraocular movements intact.  Cardiovascular:     Rate and Rhythm: Normal rate.     Pulses: Normal pulses.  Pulmonary:     Effort: Pulmonary effort is normal. No respiratory distress.  Abdominal:     General: There is no distension.     Palpations: Abdomen is soft.  Musculoskeletal:        General: No tenderness or signs of injury.     Cervical back: Neck supple.     Right lower leg: No edema.     Left lower leg: No edema.     Comments: Patient has good distal strength without clonus.  Skin:    Findings: No erythema or rash.  Neurological:     General: No focal deficit present.     Mental Status: He is alert and oriented to person, place, and time.     Sensory: No sensory deficit.     Motor: No weakness or abnormal muscle tone.     Coordination: Coordination normal.  Psychiatric:        Mood and Affect: Mood normal.        Behavior: Behavior normal.       Imaging: No results found.

## 2023-02-10 NOTE — Procedures (Signed)
Lumbar Epidural Steroid Injection - Interlaminar Approach with Fluoroscopic Guidance  Patient: Carl Davis      Date of Birth: 19-May-1966 MRN: [id number] PCP: Tally Joe, MD      Visit Date: 01/28/2023   Universal Protocol:     Consent Given By: the patient  Position: PRONE  Additional Comments: Vital signs were monitored before and after the procedure. Patient was prepped and draped in the usual sterile fashion. The correct patient, procedure, and site was verified.   Injection Procedure Details:   Procedure diagnoses: Lumbar radiculopathy [M54.16]   Meds Administered:  Meds ordered this encounter  Medications   methylPREDNISolone acetate (DEPO-MEDROL) injection 80 mg     Laterality: Left  Location/Site:  L4-5  Needle: 3.5 in., 20 ga. Tuohy  Needle Placement: Paramedian epidural  Findings:   -Comments: Excellent flow of contrast into the epidural space.  Procedure Details: Using a paramedian approach from the side mentioned above, the region overlying the inferior lamina was localized under fluoroscopic visualization and the soft tissues overlying this structure were infiltrated with 4 ml. of 1% Lidocaine without Epinephrine. The Tuohy needle was inserted into the epidural space using a paramedian approach.   The epidural space was localized using loss of resistance along with counter oblique bi-planar fluoroscopic views.  After negative aspirate for air, blood, and CSF, a 2 ml. volume of Isovue-250 was injected into the epidural space and the flow of contrast was observed. Radiographs were obtained for documentation purposes.    The injectate was administered into the level noted above.   Additional Comments:  No complications occurred Dressing: 2 x 2 sterile gauze and Band-Aid    Post-procedure details: Patient was observed during the procedure. Post-procedure instructions were reviewed.  Patient left the clinic in stable condition.

## 2023-08-05 ENCOUNTER — Other Ambulatory Visit: Payer: Self-pay | Admitting: Family Medicine

## 2023-08-05 DIAGNOSIS — R7989 Other specified abnormal findings of blood chemistry: Secondary | ICD-10-CM

## 2023-08-17 ENCOUNTER — Ambulatory Visit
Admission: RE | Admit: 2023-08-17 | Discharge: 2023-08-17 | Disposition: A | Discharge status: Home / Self Care | Payer: 59 | Source: Ambulatory Visit | Attending: Family Medicine | Admitting: Family Medicine

## 2023-08-17 DIAGNOSIS — R7989 Other specified abnormal findings of blood chemistry: Secondary | ICD-10-CM

## 2024-08-04 DIAGNOSIS — Z125 Encounter for screening for malignant neoplasm of prostate: Secondary | ICD-10-CM | POA: Diagnosis not present

## 2024-08-04 DIAGNOSIS — E7800 Pure hypercholesterolemia, unspecified: Secondary | ICD-10-CM | POA: Diagnosis not present

## 2024-08-04 DIAGNOSIS — M109 Gout, unspecified: Secondary | ICD-10-CM | POA: Diagnosis not present

## 2024-08-04 DIAGNOSIS — I10 Essential (primary) hypertension: Secondary | ICD-10-CM | POA: Diagnosis not present

## 2024-08-04 DIAGNOSIS — Z23 Encounter for immunization: Secondary | ICD-10-CM | POA: Diagnosis not present

## 2024-08-04 DIAGNOSIS — E1169 Type 2 diabetes mellitus with other specified complication: Secondary | ICD-10-CM | POA: Diagnosis not present

## 2024-08-04 DIAGNOSIS — Z0000 Encounter for general adult medical examination without abnormal findings: Secondary | ICD-10-CM | POA: Diagnosis not present
